# Patient Record
Sex: Female | Born: 1975 | Race: White | Hispanic: No | Marital: Married | Smoking: Never smoker
Health system: Southern US, Community
[De-identification: ages and names within clinical notes are randomized; demographics above are authoritative.]

## PROBLEM LIST (undated history)

## (undated) DIAGNOSIS — Z9989 Dependence on other enabling machines and devices: Secondary | ICD-10-CM

## (undated) DIAGNOSIS — G4733 Obstructive sleep apnea (adult) (pediatric): Secondary | ICD-10-CM

## (undated) DIAGNOSIS — E039 Hypothyroidism, unspecified: Secondary | ICD-10-CM

## (undated) DIAGNOSIS — H35413 Lattice degeneration of retina, bilateral: Secondary | ICD-10-CM

## (undated) DIAGNOSIS — F319 Bipolar disorder, unspecified: Secondary | ICD-10-CM

## (undated) DIAGNOSIS — H02889 Meibomian gland dysfunction of unspecified eye, unspecified eyelid: Secondary | ICD-10-CM

## (undated) HISTORY — DX: Bipolar disorder, unspecified: F31.9

## (undated) HISTORY — DX: Hypothyroidism, unspecified: E03.9

## (undated) HISTORY — DX: Dependence on other enabling machines and devices: Z99.89

## (undated) HISTORY — PX: LAPAROSCOPY FOR ECTOPIC PREGNANCY: SUR765

## (undated) HISTORY — DX: Obstructive sleep apnea (adult) (pediatric): G47.33

## (undated) HISTORY — DX: Lattice degeneration of retina, bilateral: H35.413

## (undated) HISTORY — DX: Meibomian gland dysfunction of unspecified eye, unspecified eyelid: H02.889

## (undated) HISTORY — PX: BREAST REDUCTION SURGERY: SHX8

---

## 2004-02-07 ENCOUNTER — Inpatient Hospital Stay (HOSPITAL_COMMUNITY): Admission: AD | Admit: 2004-02-07 | Discharge: 2004-02-07 | Payer: Self-pay | Admitting: Obstetrics and Gynecology

## 2004-12-17 ENCOUNTER — Inpatient Hospital Stay (HOSPITAL_COMMUNITY): Admission: AD | Admit: 2004-12-17 | Discharge: 2004-12-17 | Payer: Self-pay | Admitting: Gynecology

## 2004-12-18 ENCOUNTER — Ambulatory Visit (HOSPITAL_COMMUNITY): Admission: RE | Admit: 2004-12-18 | Discharge: 2004-12-18 | Payer: Self-pay | Admitting: *Deleted

## 2006-08-12 ENCOUNTER — Other Ambulatory Visit: Admission: RE | Admit: 2006-08-12 | Discharge: 2006-08-12 | Payer: Self-pay | Admitting: Gynecology

## 2012-01-02 ENCOUNTER — Other Ambulatory Visit: Payer: Self-pay | Admitting: Family Medicine

## 2012-02-11 ENCOUNTER — Encounter: Payer: Self-pay | Admitting: Sports Medicine

## 2012-02-11 ENCOUNTER — Ambulatory Visit (INDEPENDENT_AMBULATORY_CARE_PROVIDER_SITE_OTHER): Payer: PRIVATE HEALTH INSURANCE | Admitting: Sports Medicine

## 2012-02-11 VITALS — BP 122/83 | HR 70 | Wt 146.0 lb

## 2012-02-11 DIAGNOSIS — M79609 Pain in unspecified limb: Secondary | ICD-10-CM

## 2012-02-11 DIAGNOSIS — Q667 Congenital pes cavus, unspecified foot: Secondary | ICD-10-CM

## 2012-02-11 DIAGNOSIS — M79672 Pain in left foot: Secondary | ICD-10-CM

## 2012-02-11 DIAGNOSIS — M19079 Primary osteoarthritis, unspecified ankle and foot: Secondary | ICD-10-CM | POA: Insufficient documentation

## 2012-02-11 DIAGNOSIS — R269 Unspecified abnormalities of gait and mobility: Secondary | ICD-10-CM

## 2012-02-11 MED ORDER — MELOXICAM 15 MG PO TABS: ORAL_TABLET | ORAL | Status: DC

## 2012-02-11 NOTE — Assessment & Plan Note (Addendum)
Symptoms are predominantly due to metatarsocuneiform synovitis at the first tarsometatarsal joint. 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 She has good running shoes, however they do not sufficiently control her foot motion. I placed a medium scaphoid pads to both her shoes, and these control her motion well. I opted not to place metatarsal pads to correct her transverse arch breakdown, as she has no metatarsal pain. I would like her to do this for approximately 3 weeks. She also has weak abductors which we will work on hip abductor strengthening protocol given. She may also do Mobic daily for 2 weeks. I will see her back in 3 weeks, and if does well with the scaphoid pads, we can make her some custom molded orthotics. I do not think she has a stress injury at this point, I think it appropriate for her to continue running.  If this persists for a couple of months, and her pain continues to be localized at the metatarsocuneiform joint, we can consider an injection into the joint. I would certainly want to correct her biomechanics first.

## 2012-02-11 NOTE — Progress Notes (Signed)
Subjective:    I'm seeing this patient as a consultation for:  Dr. Angelena Sole  CC: Left foot pain  HPI: Gina Casey is a very pleasant 36 year old female, who is currently training for a half marathon. For the past 6 weeks she's noted increasing pain which he localizes over the medial aspect of the first tarsometatarsal joint of her left foot.  This was initially significantly swollen, however improved with icing. Since then she's tried changing her shoes, but this has not helped. The pain is localized, does not radiate, it is achy in nature.  She denies any trauma, denies any pain behind the medial, or lateral malleolar, and notes occasional numbness in the tip of the foot, but not on the plantar aspect. She has recently increased her mileage, has not not changed her running surface.  Unfortunately, lately she's also starting to develop some pain under both kneecaps, that occurs with some grinding if she goes up and down stairs.  Past medical history, Surgical history, Family history, Social history, Allergies, and medications have been entered into the medical record, reviewed, and no changes needed.   Review of Systems: No headache, visual changes, nausea, vomiting, diarrhea, constipation, dizziness, abdominal pain, skin rash, fevers, chills, night sweats, weight loss, swollen lymph nodes, body aches, joint swelling, muscle aches, chest pain, or shortness of breath.   Objective:   Vitals:  Afebrile, vital signs stable. General: Well Developed, well nourished, and in no acute distress.  Neuro/Psych: Alert and oriented x3, extra-ocular muscles intact, able to move all 4 extremities.  Skin: Warm and dry, no rashes noted.  Respiratory: Not using accessory muscles, speaking in full sentences, trachea midline.  Cardiovascular: Pulses palpable, no extremity edema. Abdomen: Does not appear distended. Left foot shows flexible pes cavus is symmetric bilaterally. There is some fullness between the medial  cuneiform, and the first metatarsal. This is very tender to palpation. There is no erythema. She has no tenderness over the tibialis anterior, extensor hallucis longus, extensor digitorum, tibialis posterior, flexor hallucis longus, or flexor digitorum tendons. There is no pain over the navicular prominence, or the N-spot.  Anterior drawer test is negative, kleiger test is negative, squeeze test is negative.  She has significant breakdown of the transverse arch with abnormal callus between the second and fourth metatarsal heads. She also has bunions, and bunionette.  Hip abductor strength is significantly weak bilaterally.  She runs with a fairly neutral gait.  Procedure: Limited ultrasound of left foot Device: GE logiq E. Findings: Tibialis anterior, extensor hallucis longus, extensor digitorum longus, tibialis posterior, flexor pollicis longus, flexor digitorum longus tendons are unremarkable in long, and transverse axes, without any halo sign. She does have an effusion at the metatarsocuneiform joint of the first ray.  Impression: First metatarsocuneiform joint effusion.  Images permanently stored in the unit and are available for review.  Impression and Recommendations:   This case required medical decision making of moderate complexity.

## 2012-02-11 NOTE — Patient Instructions (Addendum)
Hip home rehabilitation protocol:  1.  Side leg raises.  3x30 with no weight, then 3x15 with 2 lb ankle weight, then 3x15 with 5 lb ankle weight 2.  Standing hip rotation.  3x30 with no weight, then 3x15 with 2 lb ankle weight, then 3x15 with 5 lb ankle weight. 3.  Side step ups.  3x30 with no weight, then 3x15 with 5 lbs in backpack, then 3x15 with 10 lbs in backpack.  Mobic.  Pads for shoes.  Come back in 3 weeks to reassess, consider custom orthotics at that point.

## 2012-02-13 ENCOUNTER — Institutional Professional Consult (permissible substitution): Payer: Self-pay | Admitting: Sports Medicine

## 2012-03-03 ENCOUNTER — Ambulatory Visit (INDEPENDENT_AMBULATORY_CARE_PROVIDER_SITE_OTHER): Payer: PRIVATE HEALTH INSURANCE | Admitting: Sports Medicine

## 2012-03-03 ENCOUNTER — Encounter: Payer: Self-pay | Admitting: *Deleted

## 2012-03-03 ENCOUNTER — Encounter: Payer: Self-pay | Admitting: Sports Medicine

## 2012-03-03 ENCOUNTER — Ambulatory Visit (INDEPENDENT_AMBULATORY_CARE_PROVIDER_SITE_OTHER): Payer: PRIVATE HEALTH INSURANCE

## 2012-03-03 VITALS — BP 120/79 | HR 76 | Wt 145.0 lb

## 2012-03-03 DIAGNOSIS — M79672 Pain in left foot: Secondary | ICD-10-CM

## 2012-03-03 DIAGNOSIS — M79609 Pain in unspecified limb: Secondary | ICD-10-CM

## 2012-03-03 DIAGNOSIS — M25569 Pain in unspecified knee: Secondary | ICD-10-CM

## 2012-03-03 DIAGNOSIS — M222X9 Patellofemoral disorders, unspecified knee: Secondary | ICD-10-CM | POA: Insufficient documentation

## 2012-03-03 DIAGNOSIS — M7989 Other specified soft tissue disorders: Secondary | ICD-10-CM

## 2012-03-03 DIAGNOSIS — M25473 Effusion, unspecified ankle: Secondary | ICD-10-CM

## 2012-03-03 NOTE — Assessment & Plan Note (Addendum)
First metatarsocuneiform joint injection as above. Continue scaphoid pads, and hip rehabilitation exercises. We'll add patellofemoral pain syndrome rehabilitation exercises. X-ray of her left foot. If no better at the next visit in 4 weeks, we can consider injection of her knee joint. I would like to make her custom orthotics at the next visit. I have deferred that from this visit, as I do not want to change her gait too much before her half marathon.

## 2012-03-03 NOTE — Assessment & Plan Note (Signed)
Mobic. PFPS rehab exercises. RTC 4 weeks, inject if no better.

## 2012-03-03 NOTE — Progress Notes (Signed)
SPORTS MEDICINE CONSULTATION REPORT  Subjective:    CC: Followup  HPI: Gina Casey comes back to see me, she is about 9 weeks into having pain in her left foot, and both knees. I diagnosed her with metatarsocuneiform joint effusion as well as runner's knee at the last visit. She had weak hip abductors which she's been working on extensively. I also placed scaphoid pads in her shoes to correct overpronation.  First metatarsocuneiform joint pain: Overall about the same since last visit. Little bit better with Mobic, but not resolved. Localized, moderate, does not radiate.  Knee pain: Bilateral, anterior, worse going up and down stairs, injury classic for patellofemoral pain syndrome. Her hip abductors were weak, she's been working on these, we have not yet had her working the vastus medialis strengthening. She does have a half marathon coming up in 2 weeks.  Past medical history, Surgical history, Family history, Social history, Allergies, and medications have been entered into the medical record, reviewed, and no changes needed.   Review of Systems: No headache, visual changes, nausea, vomiting, diarrhea, constipation, dizziness, abdominal pain, skin rash, fevers, chills, night sweats, weight loss, swollen lymph nodes, body aches, joint swelling, muscle aches, chest pain, or shortness of breath.   Objective:   Vitals:  Afebrile, vital signs stable. General: Well Developed, well nourished, and in no acute distress.  Neuro/Psych: Alert and oriented x3, extra-ocular muscles intact, able to move all 4 extremities.  Skin: Warm and dry, no rashes noted.  Respiratory: Not using accessory muscles, speaking in full sentences, trachea midline.  Cardiovascular: Pulses palpable, no extremity edema. Abdomen: Does not appear distended. Bilateral Knee: Normal to inspection with no erythema or effusion or obvious bony abnormalities. Palpation normal with no warmth, joint line tenderness, patellar tenderness, or  condyle tenderness. ROM full in flexion and extension and lower leg rotation. Ligaments with solid consistent endpoints including ACL, PCL, LCL, MCL. Negative Mcmurray's, Apley's, and Thessalonian tests. Patellar compression is painful and with crepitus. Patellar and quadriceps tendons unremarkable. Hamstring and quadriceps strength is normal.   Hip abductor strength is greatly improved.  Still tender to palpation over left first metatarsal cuneiform joint. There is some fullness here as well. She has excellent strength and no pain to resisted dorsiflexion of the foot. This suggested the tibialis anterior is not involved.  Procedure: Real-time Ultrasound Guided Injection of 1st metatarsocuneiform joint. Device: GE Logiq E  Ultrasound guided injection is preferred based studies that show increased duration, increased effect, greater accuracy, decreased procedural pain, increased response rate, and decreased cost with ultrasound guided versus blind injection.  Verbal informed consent obtained.  Time-out conducted.  Noted no overlying erythema, induration, or other signs of local infection.  Skin prepped in a sterile fashion.  Local anesthesia: Topical Ethyl chloride.  With sterile technique and under real time ultrasound guidance:  1/2 cc kenalog 40, 1 cc lidocaine into joint. Completed without difficulty  Pain immediately resolved suggesting accurate placement of the medication.  Advised to call if fevers/chills, erythema, induration, drainage, or persistent bleeding.  Images permanently stored and available for review in the ultrasound unit.  Impression: Technically successful ultrasound guided injection.  I did review the x-rays, and they show some soft tissue swelling over the first metatarsocuneiform joint without any discrete bony abnormalities.  Impression and Recommendations:   This case required medical decision making of moderate complexity.

## 2012-03-31 ENCOUNTER — Encounter: Payer: Self-pay | Admitting: Sports Medicine

## 2012-03-31 ENCOUNTER — Ambulatory Visit (INDEPENDENT_AMBULATORY_CARE_PROVIDER_SITE_OTHER): Payer: PRIVATE HEALTH INSURANCE | Admitting: Sports Medicine

## 2012-03-31 VITALS — BP 126/82 | HR 80 | Wt 144.0 lb

## 2012-03-31 DIAGNOSIS — M79609 Pain in unspecified limb: Secondary | ICD-10-CM

## 2012-03-31 DIAGNOSIS — M25569 Pain in unspecified knee: Secondary | ICD-10-CM

## 2012-03-31 DIAGNOSIS — M79672 Pain in left foot: Secondary | ICD-10-CM

## 2012-03-31 DIAGNOSIS — M222X9 Patellofemoral disorders, unspecified knee: Secondary | ICD-10-CM

## 2012-03-31 NOTE — Assessment & Plan Note (Signed)
Hip Abductor strength, and patellofemoral pain improved significantly after 4 weeks of vastus medialis rehabilitation exercises. She does endorse that she has not been entirely compliant with the exercises either. She will continue these, and I have wished her luck at her next half marathon.

## 2012-03-31 NOTE — Progress Notes (Signed)
SPORTS MEDICINE CONSULTATION REPORT  Subjective:    CC: followup  NWG:NFAOZ comes back to see me for bilateral knee, and left foot pain. I injected her first metatarsocuneiform joint under ultrasound guidance, she reports that this is 90% improved. Her bilateral knee pain has improved significantly with patellofemoral rehabilitation exercises as well as hip abductor rehabilitation. She did very well and her recent half marathon, and is eager to run an additional one next saturday.   Past medical history, Surgical history, Family history, Social history, Allergies, and medications have been entered into the medical record, reviewed, and no changes needed.   Review of Systems: No headache, visual changes, nausea, vomiting, diarrhea, constipation, dizziness, abdominal pain, skin rash, fevers, chills, night sweats, weight loss, swollen lymph nodes, body aches, joint swelling, muscle aches, chest pain, shortness of breath, mood changes, visual or auditory hallucinations.   Objective:   Vitals:  Afebrile, vital signs stable. General: Well Developed, well nourished, and in no acute distress.  Neuro/Psych: Alert and oriented x3, extra-ocular muscles intact, able to move all 4 extremities.  Skin: Warm and dry, no rashes noted.  Respiratory: Not using accessory muscles, speaking in full sentences, trachea midline.  Cardiovascular: Pulses palpable, no extremity edema. Abdomen: Does not appear distended. Bilateral Knee: Normal to inspection with no erythema or effusion or obvious bony abnormalities. Palpation normal with no warmth, joint line tenderness, patellar tenderness, or condyle tenderness. ROM full in flexion and extension and lower leg rotation. Ligaments with solid consistent endpoints including ACL, PCL, LCL, MCL. Negative Mcmurray's, Apley's, and Thessalonian tests. Non painful patellar compression. Patellar glide without crepitus. Patellar and quadriceps tendons unremarkable. Hamstring  and quadriceps strength is normal.  Hip abductor strength is excellent. Feet: Again, bilateral pes cavus, hallux valgus, bilaterally, no longer tender over the first metatarsocuneiform joint on the left foot  Impression and Recommendations:   This case required medical decision making of moderate complexity.

## 2012-03-31 NOTE — Assessment & Plan Note (Addendum)
Pain at the first metatarsocuneiform joint is 90% resolved after a single ultrasound guided intra-articular injection. She should continue her rehabilitation exercises, as well as scaphoid pads. If it ain't broke, don't fix it, so we will not build her custom orthotics yet. I will see her back in one month.

## 2012-05-03 ENCOUNTER — Encounter: Payer: Self-pay | Admitting: Sports Medicine

## 2012-05-03 ENCOUNTER — Ambulatory Visit (INDEPENDENT_AMBULATORY_CARE_PROVIDER_SITE_OTHER): Payer: PRIVATE HEALTH INSURANCE | Admitting: Sports Medicine

## 2012-05-03 VITALS — BP 113/78 | HR 81 | Wt 142.0 lb

## 2012-05-03 DIAGNOSIS — M222X9 Patellofemoral disorders, unspecified knee: Secondary | ICD-10-CM

## 2012-05-03 DIAGNOSIS — R269 Unspecified abnormalities of gait and mobility: Secondary | ICD-10-CM

## 2012-05-03 DIAGNOSIS — M25569 Pain in unspecified knee: Secondary | ICD-10-CM

## 2012-05-03 NOTE — Assessment & Plan Note (Signed)
She will come back for custom orthotics. We will likely only bill the office visit.

## 2012-05-03 NOTE — Progress Notes (Signed)
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 Unfortunately, she is not been doing her patellofemoral rehabilitation exercises, and as a result continues to have pain under both kneecaps when she goes up and down stairs, squats, or gets up out of a chair. Unfortunately this has decreased her ability to run.  She has been working on her hip abductor rehabilitation and this is improved significantly.  Foot pain: She is status post a metatarsal medial cuneiform joint injection in the past. This continues to be improved. She is interested in custom orthotics, she does have scaphoid pads in both shoes.   Past medical history, Surgical history, Family history, Social history, Allergies, and medications have been entered into the medical record, reviewed, and no changes needed.   Review of Systems: No headache, visual changes, nausea, vomiting, diarrhea, constipation, dizziness, abdominal pain, skin rash, fevers, chills, night sweats, weight loss, swollen lymph nodes, body aches, joint swelling, muscle aches, chest pain, shortness of breath, mood changes, visual or auditory hallucinations.   Objective:   Vitals:  Afebrile, vital signs stable. General: Well Developed, well nourished, and in no acute distress.  Neuro/Psych: Alert and oriented x3, extra-ocular muscles intact, able to move all 4 extremities.  Skin: Warm and dry, no rashes noted.  Respiratory: Not using accessory muscles, speaking in full sentences, trachea midline.  Cardiovascular: Pulses palpable, no extremity edema. Abdomen: Does not appear distended. Bilateral Knees: Normal to inspection with no erythema or effusion or obvious bony abnormalities. Palpation normal with no warmth, joint line tenderness, patellar tenderness, or condyle tenderness. ROM full in flexion and extension and lower leg rotation. Ligaments  with solid consistent endpoints including ACL, PCL, LCL, MCL. Negative Mcmurray's, Apley's, and Thessalonian tests. Non painful patellar compression. Patellar glide without crepitus. Patellar and quadriceps tendons unremarkable. Hamstring and quadriceps strength is normal.    hip abductor strength is excellent bilaterally.   Impression and Recommendations:   This case required medical decision making of moderate complexity.

## 2012-05-03 NOTE — Assessment & Plan Note (Signed)
She'll come back for an appointment of the custom orthotics. We will likely bill only the office visit. Formal physical therapy for patellofemoral syndrome as well as McConnell taping. Return to clinic in 3-4 weeks, consider injection bilaterally if no better.

## 2012-05-12 ENCOUNTER — Ambulatory Visit: Payer: PRIVATE HEALTH INSURANCE | Attending: Sports Medicine | Admitting: Physical Therapy

## 2012-05-12 DIAGNOSIS — IMO0001 Reserved for inherently not codable concepts without codable children: Secondary | ICD-10-CM | POA: Insufficient documentation

## 2012-05-12 DIAGNOSIS — M25569 Pain in unspecified knee: Secondary | ICD-10-CM | POA: Insufficient documentation

## 2012-05-12 DIAGNOSIS — M6281 Muscle weakness (generalized): Secondary | ICD-10-CM | POA: Insufficient documentation

## 2012-05-12 DIAGNOSIS — R293 Abnormal posture: Secondary | ICD-10-CM | POA: Insufficient documentation

## 2012-05-12 DIAGNOSIS — M25469 Effusion, unspecified knee: Secondary | ICD-10-CM | POA: Insufficient documentation

## 2012-05-14 ENCOUNTER — Ambulatory Visit: Payer: PRIVATE HEALTH INSURANCE | Admitting: Physical Therapy

## 2012-05-17 ENCOUNTER — Ambulatory Visit: Payer: PRIVATE HEALTH INSURANCE | Admitting: Physical Therapy

## 2012-05-18 ENCOUNTER — Ambulatory Visit (INDEPENDENT_AMBULATORY_CARE_PROVIDER_SITE_OTHER): Payer: PRIVATE HEALTH INSURANCE | Admitting: Sports Medicine

## 2012-05-18 ENCOUNTER — Encounter: Payer: Self-pay | Admitting: Sports Medicine

## 2012-05-18 VITALS — BP 112/74 | HR 86 | Wt 144.0 lb

## 2012-05-18 DIAGNOSIS — M25569 Pain in unspecified knee: Secondary | ICD-10-CM

## 2012-05-18 DIAGNOSIS — M79672 Pain in left foot: Secondary | ICD-10-CM

## 2012-05-18 DIAGNOSIS — Q667 Congenital pes cavus, unspecified foot: Secondary | ICD-10-CM

## 2012-05-18 DIAGNOSIS — R269 Unspecified abnormalities of gait and mobility: Secondary | ICD-10-CM

## 2012-05-18 DIAGNOSIS — M79609 Pain in unspecified limb: Secondary | ICD-10-CM

## 2012-05-18 DIAGNOSIS — M222X9 Patellofemoral disorders, unspecified knee: Secondary | ICD-10-CM

## 2012-05-18 NOTE — Progress Notes (Signed)
Patient was fitted for a : standard, cushioned, semi-rigid orthotic. The orthotic was heated and afterward the patient stood on the orthotic blank positioned on the orthotic stand. The patient was positioned in subtalar neutral position and 10 degrees of ankle dorsiflexion in a weight bearing stance. After completion of molding, a stable base was applied to the orthotic blank. The blank was ground to a stable position for weight bearing. Size 7 Base: Blue EVA Additional Posting and Padding: None The patient ambulated these, and they were very comfortable.

## 2012-05-25 ENCOUNTER — Ambulatory Visit: Payer: PRIVATE HEALTH INSURANCE | Admitting: Physical Therapy

## 2012-06-18 ENCOUNTER — Ambulatory Visit: Payer: PRIVATE HEALTH INSURANCE | Admitting: Sports Medicine

## 2012-06-18 DIAGNOSIS — Z0289 Encounter for other administrative examinations: Secondary | ICD-10-CM

## 2012-11-26 DIAGNOSIS — H35413 Lattice degeneration of retina, bilateral: Secondary | ICD-10-CM

## 2012-11-26 DIAGNOSIS — H5210 Myopia, unspecified eye: Secondary | ICD-10-CM | POA: Insufficient documentation

## 2012-11-26 DIAGNOSIS — H02889 Meibomian gland dysfunction of unspecified eye, unspecified eyelid: Secondary | ICD-10-CM

## 2012-11-26 HISTORY — DX: Lattice degeneration of retina, bilateral: H35.413

## 2012-11-26 HISTORY — DX: Meibomian gland dysfunction of unspecified eye, unspecified eyelid: H02.889

## 2014-01-31 ENCOUNTER — Ambulatory Visit: Payer: PRIVATE HEALTH INSURANCE | Admitting: Sports Medicine

## 2014-01-31 ENCOUNTER — Encounter: Payer: Self-pay | Admitting: Sports Medicine

## 2014-01-31 ENCOUNTER — Ambulatory Visit (INDEPENDENT_AMBULATORY_CARE_PROVIDER_SITE_OTHER): Payer: PRIVATE HEALTH INSURANCE | Admitting: Sports Medicine

## 2014-01-31 VITALS — BP 120/62 | HR 78 | Ht 64.0 in | Wt 143.0 lb

## 2014-01-31 DIAGNOSIS — M79672 Pain in left foot: Secondary | ICD-10-CM

## 2014-01-31 NOTE — Assessment & Plan Note (Signed)
Overall did extremely well after her initial left-sided first metatarsocuneiform injection 2 years ago. Unfortunately discontinued her custom orthotics, and now has a recurrence of pain. Continue orthotics, injection as above, ice after running. She is doing approximately 30 miles per week. I would like to see her before her marathon on November 1.

## 2014-01-31 NOTE — Progress Notes (Signed)
  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 Initially we treated her for the first metatarsal cuboid osteoarthritis, she was doing extremely well in her custom orthotics, and post injection 2 years ago until she discontinued her orthotics during training for a marathon coming up in November. She now has pain in the exact same location, severe, persistent. She is currently doing approximately 30 miles a week. Pain does not radiate.  Past medical history, Surgical history, Family history not pertinant except as noted below, Social history, Allergies, and medications have been entered into the medical record, reviewed, and no changes needed.   Review of Systems: No fevers, chills, night sweats, weight loss, chest pain, or shortness of breath.   Objective:    General: Well Developed, well nourished, and in no acute distress.  Neuro: Alert and oriented x3, extra-ocular muscles intact, sensation grossly intact.  HEENT: Normocephalic, atraumatic, pupils equal round reactive to light, neck supple, no masses, no lymphadenopathy, thyroid nonpalpable.  Skin: Warm and dry, no rashes. Cardiac: Regular rate and rhythm, no murmurs rubs or gallops, no lower extremity edema.  Respiratory: Clear to auscultation bilaterally. Not using accessory muscles, speaking in full sentences. Left Foot: No visible erythema or swelling. Range of motion is full in all directions. Strength is 5/5 in all directions. No hallux valgus. No pes cavus or pes planus. No abnormal callus noted. No pain over the navicular prominence, or base of fifth metatarsal. No tenderness to palpation of the calcaneal insertion of plantar fascia. No pain at the Achilles insertion. No pain over the calcaneal bursa. No pain of the retrocalcaneal bursa. Visible swelling with tenderness to palpation at the first metatarsal cuneiform joint No hallux rigidus or  limitus. No tenderness palpation over interphalangeal joints. No pain with compression of the metatarsal heads. Neurovascularly intact distally.  Procedure: Real-time Ultrasound Guided Injection of left first metatarsal cuneiform joint Device: GE Logiq E  Verbal informed consent obtained.  Time-out conducted.  Noted no overlying erythema, induration, or other signs of local infection.  Skin prepped in a sterile fashion.  Local anesthesia: Topical Ethyl chloride.  With sterile technique and under real time ultrasound guidance:  25-gauge needle advanced into the joint, 0.5 cc kenalog 40, 0.5 cc lidocaine injected easily. Completed without difficulty  Pain immediately resolved suggesting accurate placement of the medication.  Advised to call if fevers/chills, erythema, induration, drainage, or persistent bleeding.  Images permanently stored and available for review in the ultrasound unit.  Impression: Technically successful ultrasound guided injection.  Impression and Recommendations:

## 2014-02-21 ENCOUNTER — Ambulatory Visit: Payer: PRIVATE HEALTH INSURANCE | Admitting: Sports Medicine

## 2014-05-19 ENCOUNTER — Ambulatory Visit: Payer: PRIVATE HEALTH INSURANCE | Admitting: Sports Medicine

## 2014-05-24 ENCOUNTER — Ambulatory Visit (INDEPENDENT_AMBULATORY_CARE_PROVIDER_SITE_OTHER): Payer: BLUE CROSS/BLUE SHIELD | Admitting: Sports Medicine

## 2014-05-24 ENCOUNTER — Encounter: Payer: Self-pay | Admitting: Sports Medicine

## 2014-05-24 DIAGNOSIS — M129 Arthropathy, unspecified: Secondary | ICD-10-CM | POA: Diagnosis not present

## 2014-05-24 DIAGNOSIS — M19079 Primary osteoarthritis, unspecified ankle and foot: Secondary | ICD-10-CM

## 2014-05-24 MED ORDER — DICLOFENAC SODIUM 2 % TD SOLN: 2.0000 | Freq: Two times a day (BID) | TRANSDERMAL | Status: DC

## 2014-05-24 NOTE — Assessment & Plan Note (Signed)
Worst at the first metatarsal/medial cuneiform joint. Adding topical diclofenac Previous injection was 3 months ago. Unfortunately pain did recur approximately a month ago. Repeat first metatarsal\medial cuneiform injection this time with Depo-Medrol. She does well in her custom orthotics however her high heels do not support her pes cavus very well. Return in a month to see how things are going, next step would be joint fusion.

## 2014-05-24 NOTE — Progress Notes (Signed)
  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 Is localized in the same place with swelling. She did well in her marathon in November. 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 Her high heels do have a three quarters length rigid plastic orthotic however are significantly insufficient with regards to support of her midfoot. Pain is moderate, persistent and localized without radiation.  Past medical history, Surgical history, Family history not pertinant except as noted below, Social history, Allergies, and medications have been entered into the medical record, reviewed, and no changes needed.   Review of Systems: No fevers, chills, night sweats, weight loss, chest pain, or shortness of breath.   Objective:    General: Well Developed, well nourished, and in no acute distress.  Neuro: Alert and oriented x3, extra-ocular muscles intact, sensation grossly intact.  HEENT: Normocephalic, atraumatic, pupils equal round reactive to light, neck supple, no masses, no lymphadenopathy, thyroid nonpalpable.  Skin: Warm and dry, no rashes. Cardiac: Regular rate and rhythm, no murmurs rubs or gallops, no lower extremity edema.  Respiratory: Clear to auscultation bilaterally. Not using accessory muscles, speaking in full sentences. Left Foot: No visible erythema or swelling. Range of motion is full in all directions. Strength is 5/5 in all directions. No hallux valgus. No pes cavus or pes planus. No abnormal callus noted. No pain over the navicular prominence, or base of fifth metatarsal. No tenderness to palpation of the calcaneal insertion of plantar fascia. No pain at the Achilles insertion. No pain over the calcaneal  bursa. No pain of the retrocalcaneal bursa. Swelling with tenderness to palpation over the first metatarsocuneiform joint No hallux rigidus or limitus. No tenderness palpation over interphalangeal joints. No pain with compression of the metatarsal heads. Neurovascularly intact distally.  Procedure: Real-time Ultrasound Guided Injection of left first metatarsocuneiform joint Device: GE Logiq E  Verbal informed consent obtained.  Time-out conducted.  Noted no overlying erythema, induration, or other signs of local infection.  Skin prepped in a sterile fashion.  Local anesthesia: Topical Ethyl chloride.  With sterile technique and under real time ultrasound guidance:  25-gauge needle advanced into joint, 0.5 mL Depo-Medrol 40, 0.5 mL lidocaine injected easily. Completed without difficulty  Pain immediately resolved suggesting accurate placement of the medication.  Advised to call if fevers/chills, erythema, induration, drainage, or persistent bleeding.  Images permanently stored and available for review in the ultrasound unit.  Impression: Technically successful ultrasound guided injection.  Scaphoid has were placed in the patient's high heels.  Impression and Recommendations:

## 2014-06-22 ENCOUNTER — Ambulatory Visit (INDEPENDENT_AMBULATORY_CARE_PROVIDER_SITE_OTHER): Payer: BLUE CROSS/BLUE SHIELD | Admitting: Sports Medicine

## 2014-06-22 ENCOUNTER — Encounter: Payer: Self-pay | Admitting: Sports Medicine

## 2014-06-22 DIAGNOSIS — M129 Arthropathy, unspecified: Secondary | ICD-10-CM | POA: Diagnosis not present

## 2014-06-22 DIAGNOSIS — M19079 Primary osteoarthritis, unspecified ankle and foot: Secondary | ICD-10-CM

## 2014-06-22 NOTE — Assessment & Plan Note (Signed)
Unfortunately we have now failed custom orthotics, topical anti-inflammatories, 2 injections into the metatarsocuneiform joint. She does run approximately 10 miles a week. We are going to proceed with an MRI, this appears to be a stress reaction, we will mobilize her with limited weightbearing, it appears to be simple degenerative change she will probably need a fusion. Return to go over MRI results.

## 2014-06-22 NOTE — Progress Notes (Signed)
  Subjective:    CC: Follow-up  HPI: Gina Casey is now had symptoms for over a year, she is a runner, approximately 10 miles per week and has had pain in the articulation of the first metatarsal and medial cuneiform. We tried topical agents, rehabilitation for greater than 6 weeks, custom orthotics, oral medications, and injections. She is improved significantly since the last visit, however continues to have pain and swelling at this location.  Past medical history, Surgical history, Family history not pertinant except as noted below, Social history, Allergies, and medications have been entered into the medical record, reviewed, and no changes needed.   Review of Systems: No fevers, chills, night sweats, weight loss, chest pain, or shortness of breath.   Objective:    General: Well Developed, well nourished, and in no acute distress.  Neuro: Alert and oriented x3, extra-ocular muscles intact, sensation grossly intact.  HEENT: Normocephalic, atraumatic, pupils equal round reactive to light, neck supple, no masses, no lymphadenopathy, thyroid nonpalpable.  Skin: Warm and dry, no rashes. Cardiac: Regular rate and rhythm, no murmurs rubs or gallops, no lower extremity edema.  Respiratory: Clear to auscultation bilaterally. Not using accessory muscles, speaking in full sentences. Left Foot: No visible erythema or swelling. Range of motion is full in all directions. Strength is 5/5 in all directions. No hallux valgus. No pes cavus or pes planus. No abnormal callus noted. No pain over the navicular prominence, or base of fifth metatarsal. No tenderness to palpation of the calcaneal insertion of plantar fascia. No pain at the Achilles insertion. No pain over the calcaneal bursa. No pain of the retrocalcaneal bursa. Tender to palpation at the first metatarsal/cuneiform joint. No hallux rigidus or limitus. No tenderness palpation over interphalangeal joints. No pain with compression of the  metatarsal heads. Neurovascularly intact distally.  Impression and Recommendations:

## 2014-06-29 ENCOUNTER — Ambulatory Visit (INDEPENDENT_AMBULATORY_CARE_PROVIDER_SITE_OTHER): Payer: BLUE CROSS/BLUE SHIELD

## 2014-06-29 DIAGNOSIS — M25572 Pain in left ankle and joints of left foot: Secondary | ICD-10-CM

## 2014-07-12 ENCOUNTER — Encounter: Payer: Self-pay | Admitting: Sports Medicine

## 2014-07-12 ENCOUNTER — Ambulatory Visit (INDEPENDENT_AMBULATORY_CARE_PROVIDER_SITE_OTHER): Payer: BLUE CROSS/BLUE SHIELD | Admitting: Sports Medicine

## 2014-07-12 ENCOUNTER — Telehealth: Payer: Self-pay | Admitting: Sports Medicine

## 2014-07-12 DIAGNOSIS — M19079 Primary osteoarthritis, unspecified ankle and foot: Secondary | ICD-10-CM

## 2014-07-12 DIAGNOSIS — M129 Arthropathy, unspecified: Secondary | ICD-10-CM

## 2014-07-12 NOTE — Telephone Encounter (Signed)
FYI. Dr Karie Schwalbe, Ms. Kallie LocksKissane is scheduled to see Dr Ihor GullyBiggerstaff on March 23rd. She is aware of appt.

## 2014-07-12 NOTE — Assessment & Plan Note (Signed)
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 Her MRI does show some tibialis anterior and flexor pollicis longus tendinitis however pain is not referable to the structures, at this point I do think she is a candidate for a tarsometatarsal fusion type procedure, I would like her to see Dr. Toni ArthursJohn Hewitt with foot and ankle surgery. When she runs, she should definitely ice the joint immediately afterwards. [place] does [place] on [place] 6.

## 2014-07-12 NOTE — Progress Notes (Signed)
  Subjective:    CC: Follow-up MRI  HPI: I have been treating Gina Casey now for over one year, she has medial cuneiform/first metatarsal osteoarthritis with synovitis, she's responded relatively well to injections, custom orthotics, and meloxicam however unfortunately is continuing to have pain, persistent. At this point she is failed medical management. [place] does [place] in November. We obtained an MRI and she is here to go over results.  Past medical history, Surgical history, Family history not pertinant except as noted below, Social history, Allergies, and medications have been entered into the medical record, reviewed, and no changes needed.   Review of Systems: No fevers, chills, night sweats, weight loss, chest pain, or shortness of breath.   Objective:    General: Well Developed, well nourished, and in no acute distress.  Neuro: Alert and oriented x3, extra-ocular muscles intact, sensation grossly intact.  HEENT: Normocephalic, atraumatic, pupils equal round reactive to light, neck supple, no masses, no lymphadenopathy, thyroid nonpalpable.  Skin: Warm and dry, no rashes. Cardiac: Regular rate and rhythm, no murmurs rubs or gallops, no lower extremity edema.  Respiratory: Clear to auscultation bilaterally. Not using accessory muscles, speaking in full sentences. Left Foot: No visible erythema or swelling. Range of motion is full in all directions. Strength is 5/5 in all directions. No hallux valgus. No pes cavus or pes planus. No abnormal callus noted. No pain over the navicular prominence, or base of fifth metatarsal. No tenderness to palpation of the calcaneal insertion of plantar fascia. No pain at the Achilles insertion. No pain over the calcaneal bursa. No pain of the retrocalcaneal bursa. Tender to palpation at the first metatarsocuneiform joint, there is palpable synovitis, she has no tenderness over the flexor hallucis longus or tibialis  anterior No hallux rigidus or limitus. No tenderness palpation over interphalangeal joints. No pain with compression of the metatarsal heads. Neurovascularly intact distally.  MRI shows age advanced osteoarthritis with synovitis at the first metatarsocuneiform joint, she also has tenosynovitis in the flexor hallucis longus as well as the tibialis anterior tendons, I do suspect these are likely simply reactive edema.  Impression and Recommendations:

## 2015-03-19 ENCOUNTER — Encounter: Payer: Self-pay | Admitting: Family Medicine

## 2015-03-19 ENCOUNTER — Ambulatory Visit (INDEPENDENT_AMBULATORY_CARE_PROVIDER_SITE_OTHER): Payer: BLUE CROSS/BLUE SHIELD | Admitting: Family Medicine

## 2015-03-19 VITALS — BP 107/77 | HR 66 | Wt 149.0 lb

## 2015-03-19 DIAGNOSIS — M545 Low back pain, unspecified: Secondary | ICD-10-CM | POA: Insufficient documentation

## 2015-03-19 DIAGNOSIS — S39012A Strain of muscle, fascia and tendon of lower back, initial encounter: Secondary | ICD-10-CM

## 2015-03-19 DIAGNOSIS — S338XXA Sprain of other parts of lumbar spine and pelvis, initial encounter: Secondary | ICD-10-CM

## 2015-03-19 MED ORDER — CYCLOBENZAPRINE HCL 10 MG PO TABS: 10.0000 mg | ORAL_TABLET | Freq: Three times a day (TID) | ORAL | Status: DC | PRN

## 2015-03-19 MED ORDER — NAPROXEN 500 MG PO TABS: 500.0000 mg | ORAL_TABLET | Freq: Two times a day (BID) | ORAL | Status: AC

## 2015-03-19 NOTE — Assessment & Plan Note (Signed)
Symptoms most consistent with lumbosacral strain. Plan for naproxen Flexeril and physical therapy. Warned about gastric protection on NSAIDs. Recheck in one month.

## 2015-03-19 NOTE — Progress Notes (Signed)
Faxed copy of notes to  [phone].

## 2015-03-19 NOTE — Patient Instructions (Signed)
Thank you for coming in today. Take naproxen twice daily as needed for pain. Take Flexeril mostly at bedtime for muscle pain and spasm. Use a heating pad. Attended physical therapy. If you take naproxen for longer than a week at a time take it with Zantac, Pepcid, or Prilosec over-the-counter to protect your stomach.  TENS UNIT: This is helpful for muscle pain and spasm.   Search and Purchase a TENS 7000 2nd edition at www.tenspros.com. It should be less than $30.     TENS unit instructions: Do not shower or bathe with the unit on Turn the unit off before removing electrodes or batteries If the electrodes lose stickiness add a drop of water to the electrodes after they are disconnected from the unit and place on plastic sheet. If you continued to have difficulty, call the TENS unit company to purchase more electrodes. Do not apply lotion on the skin area prior to use. Make sure the skin is clean and dry as this will help prolong the life of the electrodes. After use, always check skin for unusual red areas, rash or other skin difficulties. If there are any skin problems, does not apply electrodes to the same area. Never remove the electrodes from the unit by pulling the wires. Do not use the TENS unit or electrodes other than as directed. Do not change electrode placement without consultating your therapist or physician. Keep 2 fingers with between each electrode. Wear time ratio is 2:1, on to off times.    For example on for 30 minutes off for 15 minutes and then on for 30 minutes off for 15 minutes   Lumbosacral Strain Lumbosacral strain is a strain of any of the parts that make up your lumbosacral vertebrae. Your lumbosacral vertebrae are the bones that make up the lower third of your backbone. Your lumbosacral vertebrae are held together by muscles and tough, fibrous tissue (ligaments).  CAUSES  A sudden blow to your back can cause lumbosacral strain. Also, anything that causes an  excessive stretch of the muscles in the low back can cause this strain. This is typically seen when people exert themselves strenuously, fall, lift heavy objects, bend, or crouch repeatedly. RISK FACTORS  Physically demanding work.  Participation in pushing or pulling sports or sports that require a sudden twist of the back (tennis, golf, baseball).  Weight lifting.  Excessive lower back curvature.  Forward-tilted pelvis.  Weak back or abdominal muscles or both.  Tight hamstrings. SIGNS AND SYMPTOMS  Lumbosacral strain may cause pain in the area of your injury or pain that moves (radiates) down your leg.  DIAGNOSIS Your health care provider can often diagnose lumbosacral strain through a physical exam. In some cases, you may need tests such as X-ray exams.  TREATMENT  Treatment for your lower back injury depends on many factors that your clinician will have to evaluate. However, most treatment will include the use of anti-inflammatory medicines. HOME CARE INSTRUCTIONS   Avoid hard physical activities (tennis, racquetball, waterskiing) if you are not in proper physical condition for it. This may aggravate or create problems.  If you have a back problem, avoid sports requiring sudden body movements. Swimming and walking are generally safer activities.  Maintain good posture.  Maintain a healthy weight.  For acute conditions, you may put ice on the injured area.  Put ice in a plastic bag.  Place a towel between your skin and the bag.  Leave the ice on for 20 minutes, 2-3 times a  day.  When the low back starts healing, stretching and strengthening exercises may be recommended. SEEK MEDICAL CARE IF:  Your back pain is getting worse.  You experience severe back pain not relieved with medicines. SEEK IMMEDIATE MEDICAL CARE IF:   You have numbness, tingling, weakness, or problems with the use of your arms or legs.  There is a change in bowel or bladder control.  You have  increasing pain in any area of the body, including your belly (abdomen).  You notice shortness of breath, dizziness, or feel faint.  You feel sick to your stomach (nauseous), are throwing up (vomiting), or become sweaty.  You notice discoloration of your toes or legs, or your feet get very cold. MAKE SURE YOU:   Understand these instructions.  Will watch your condition.  Will get help right away if you are not doing well or get worse.   This information is not intended to replace advice given to you by your health care provider. Make sure you discuss any questions you have with your health care provider.   Document Released: 01/22/2005 Document Revised: 05/05/2014 Document Reviewed: 12/01/2012 Elsevier Interactive Patient Education [employer]

## 2015-03-19 NOTE — Progress Notes (Signed)
   Subjective:    I'm seeing this patient as a consultation for:  Dr Angelena SoleWeston Saunders  CC: Back pain  HPI: Patient was in her normal state of health in early November when she [place]. She ran an about 5.5 hours. She notes that since following the run she has significant lumbar pain. The pain is located in the low back. The pain does not radiate. No weakness numbness bowel bladder dysfunction. She denies any specific injury. She's tried some over-the-counter medicines which helped a bit. Pain is worse when she stands from a seated position and with forward flexion. Pain is better with laying on her back. The pain is interfering with her ability to exercise and is bothersome at work.  Past medical history, Surgical history, Family history not pertinant except as noted below, Social history, Allergies, and medications have been entered into the medical record, reviewed, and no changes needed.   Review of Systems: No headache, visual changes, nausea, vomiting, diarrhea, constipation, dizziness, abdominal pain, skin rash, fevers, chills, night sweats, weight loss, swollen lymph nodes, body aches, joint swelling, muscle aches, chest pain, shortness of breath, mood changes, visual or auditory hallucinations.   Objective:    Filed Vitals:   03/19/15 0955  BP: 107/77  Pulse: 66   General: Well Developed, well nourished, and in no acute distress.  Neuro/Psych: Alert and oriented x3, extra-ocular muscles intact, able to move all 4 extremities, sensation grossly intact. Skin: Warm and dry, no rashes noted.  Respiratory: Not using accessory muscles, speaking in full sentences, trachea midline.  Cardiovascular: Pulses palpable, no extremity edema. Abdomen: Does not appear distended. MSK: Back: Nontender to spinal midline. Mildly tender to palpation bilateral SI joints. Range of motion limited by pain. Negative straight leg test, Faber test bilaterally. A positive pretzel  crossover stretch bilaterally. Lower extremity reflexes and strength are intact. Patient can rise from a seated position get on and off exam table stand on her toes and heels and squat. Sensation is intact bilateral lower extremities.  No results found for this or any previous visit (from the past 24 hour(s)). No results found.  Impression and Recommendations:   This case required medical decision making of moderate complexity. Dr

## 2015-03-27 ENCOUNTER — Ambulatory Visit (INDEPENDENT_AMBULATORY_CARE_PROVIDER_SITE_OTHER): Payer: BLUE CROSS/BLUE SHIELD | Admitting: Rehabilitative and Restorative Service Providers"

## 2015-03-27 ENCOUNTER — Encounter: Payer: Self-pay | Admitting: Rehabilitative and Restorative Service Providers"

## 2015-03-27 DIAGNOSIS — S338XXA Sprain of other parts of lumbar spine and pelvis, initial encounter: Secondary | ICD-10-CM

## 2015-03-27 DIAGNOSIS — M629 Disorder of muscle, unspecified: Secondary | ICD-10-CM

## 2015-03-27 DIAGNOSIS — S39012A Strain of muscle, fascia and tendon of lower back, initial encounter: Secondary | ICD-10-CM

## 2015-03-27 DIAGNOSIS — Z7409 Other reduced mobility: Secondary | ICD-10-CM | POA: Diagnosis not present

## 2015-03-27 DIAGNOSIS — M6289 Other specified disorders of muscle: Secondary | ICD-10-CM

## 2015-03-27 DIAGNOSIS — R531 Weakness: Secondary | ICD-10-CM

## 2015-03-27 NOTE — Patient Instructions (Signed)
Abdominal Bracing With Pelvic Floor (Hook-Lying)    With neutral spine, tighten pelvic floor and abdominals sucking belly button to back bone; tighten muscles in back at waist. Hold 10-20 sec  Repeat _10 __ times. Do _several__ times a day. Progress to do this in sitting; standing; walking and running and functional activities  Trunk: Prone Extension (Press-Ups)    Lie on stomach on firm, flat surface. Relax bottom and legs. Raise chest in air with elbows straight. Keep hips flat on surface, sag stomach. Hold 2-3____ seconds. Repeat __10 times. Do _2-3___ sessions per day. CAUTION: Movement should be gentle and slow.   Hip Flexor, Quadricep Stretch: Belly Down (Strap)    Engage stable pelvic tilt. Hold top of foot with hand or strap. Hold for _30 sec. Repeat ___3_ times each leg.  Angry Cat Stretch    Tuck chin and tighten stomach, arching back. Pause 2-3 sec  Repeat _10___ times per set. Do __2-3__ sessions per day.     Kneeling    Kneel, sitting on heels, arms forward on floor. Push chest toward floor, reaching forward as far as possible. Hold _20-30__ seconds. Repeat _3__ times per session. Do _2-3__ sessions per day.   HIP: Hamstrings - Supine    Place strap around foot. Raise leg up, keep knee straight. Hold _30__ seconds. __3_ reps per set, _2-3_times per day    Outer Hip Stretch: Reclined IT Band Stretch (Strap)    Strap around opposite foot, pull across only as far as possible with shoulders on mat. Hold for __30 sec. Repeat _3___ times each leg.

## 2015-03-27 NOTE — Therapy (Signed)
[facility] [place] [address] [place], [place], [address] Phone: [phone]   Fax:  [phone]  Physical Therapy Evaluation  Patient Details  Name: Gina Casey MRN: [id number] Date of Birth: [birth year] Referring Provider: Dr Clementeen Graham   Encounter Date: 03/27/2015      PT End of Session - 03/27/15 0906    Visit Number 1   Number of Visits 12   Date for PT Re-Evaluation 05/08/15   PT Start Time 0808   PT Stop Time 0852   PT Time Calculation (min) 44 min   Activity Tolerance Patient tolerated treatment well      Past Medical History  Diagnosis Date  . Hypothyroidism     Past Surgical History  Procedure Laterality Date  . Breast reduction surgery    . Laparoscopy for ectopic pregnancy      There were no vitals filed for this visit.  Visit Diagnosis:  Muscular imbalance - Plan: PT plan of care cert/re-cert  Lumbosacral strain, initial encounter - Plan: PT plan of care cert/re-cert  Decreased strength, endurance, and mobility - Plan: PT plan of care cert/re-cert      Subjective Assessment - 03/27/15 0807    Subjective Pt reports that she did the [place] marathon 03/04/15 and noticed that she was having pain and soreness all over and then noticed that she had soreness in Rt hip and then settled in her back. She has had soreness, stiffness, pain  and spasms in the ow back. Seen by MD 03/19/15 and has been using meds and TENS unit with some improvement. She has continued stiffness in the LB and has not returned to normal functional activities. She ran 3.1 miles last week and did OK.   Pertinent History Lt knee pain treated~2 yrs ago and Lt foot arthritis treated in the spring in boot for ~8 weeks   How long can you sit comfortably? 1-2 hr   How long can you stand comfortably? 20-30 min    How long can you walk comfortably? no limit    Patient Stated Goals get rid of stiffness; strengthening core; return to running at  same level   Currently in Pain? No/denies            [facility] PT Assessment - 03/27/15 0001    Assessment   Medical Diagnosis lumbosacral strain   Referring Provider Dr Clementeen Graham    Onset Date/Surgical Date 03/04/15   Hand Dominance Right   Next MD Visit 12/16   Prior Therapy not for LBP   Precautions   Precautions None   Balance Screen   Has the patient fallen in the past 6 months No   Has the patient had a decrease in activity level because of a fear of falling?  No   Is the patient reluctant to leave their home because of a fear of falling?  No   Home Environment   Additional Comments no problems now did have difficulty with any movement   Prior Function   Level of Independence Independent   Vocation Full time employment   Vocation Requirements at desk/computer 8 hr/day    Leisure runs training 3-5 days/wk at least 5 miles each day; household chores; cooking; Archivist on the sofa   Observation/Other Assessments   Focus on Therapeutic Outcomes (FOTO)  46% limitation    Sensation   Additional Comments WFL's per pt report   Posture/Postural Control   Posture Comments head forward; shoulders rounded and elevated; scapulae abducted  and rotated along the thoracic wall; increased thoracic kyphosis; increased lumbar lordosis   AROM   Lumbar Flexion 95%   Lumbar Extension 75%   Lumbar - Right Side Bend 90%   Lumbar - Left Side Bend 90%   Lumbar - Right Rotation 80%   Lumbar - Left Rotation 80%   Strength   Overall Strength Comments bilat LE's 5/5 with manual muscle testing - note Lt LE is smaller in thigh and calf than Rt    Flexibility   Hamstrings Rt 85 deg; Lt 74 deg    Quadriceps heel ~3 in from buttocks tight/pulling into quad and LB    ITB tight Lt    Piriformis tight Rt    Palpation   Spinal mobility LBP with PA mobs T8 - L5/sacrum    SI assessment  pain Lt SI    Palpation comment tight/discomfort Rt sacral border and Rt hip flexors                     [facility] Adult PT Treatment/Exercise - 03/27/15 0001    Self-Care   Self-Care --  education re sitting positions/surfaces & modifications   Lumbar Exercises: Stretches   Passive Hamstring Stretch 3 reps;30 seconds   Press Ups --  2 sec hold x 10   Quad Stretch 3 reps;30 seconds  foam roll under distal thigh   ITB Stretch 3 reps;30 seconds   Lumbar Exercises: Supine   AB Set Limitations 3 part core 10 sec hold x 10    Lumbar Exercises: Quadruped   Madcat/Old Horse 10 reps  2 sec pause   Madcat/Old Horse Limitations sit back - childs pose 20 sec hold x 3                PT Education - 03/27/15 0851    Education provided Yes   Education Details HEP   Person(s) Educated Patient   Methods Explanation;Demonstration;Tactile cues;Verbal cues;Handout   Comprehension Verbalized understanding;Returned demonstration;Verbal cues required;Tactile cues required             PT Long Term Goals - 03/27/15 0911    PT LONG TERM GOAL #1   Title Improve muscular length to demo more symmetric ROM/mobility bilat LE's 05/08/15   Time 6   Period Weeks   Status New   PT LONG TERM GOAL #2   Title Return to running schedule 4-5 times/wk for 3-5 miles without difficulty/flare up of symptoms 05/08/15   Time 6   Period Weeks   Status New   PT LONG TERM GOAL #3   Title Core stability allowing pt to do exercise/running without LBP 05/08/15   Time 6   Period Weeks   Status New   PT LONG TERM GOAL #4   Title I in HEP for discharge 05/08/15   Time 6   Period Weeks   Status New   PT LONG TERM GOAL #5   Title Improve FOTO to </= 30% limitation 05/08/15   Time 6   Status New               Plan - 03/27/15 0907    Clinical Impression Statement Gina Casey presents with resolving LBP after running a marathon 03/04/15. She has muscular imbalance; limited trunk and LE mobility; poor core strength; limited funcitonal endurance and functional activity level. She has not returned to full running  schedule. Pt will benefit form PY to address deficits identifited.    Pt will benefit from skilled therapeutic  intervention in order to improve on the following deficits Postural dysfunction;Improper body mechanics;Decreased mobility;Pain;Decreased endurance;Decreased activity tolerance   Rehab Potential Good   PT Frequency 2x / week   PT Duration 6 weeks   PT Treatment/Interventions Patient/family education;ADLs/Self Care Home Management;Manual techniques;Neuromuscular re-education;Dry needling;Cryotherapy;Electrical Stimulation;Moist Heat;Ultrasound;Therapeutic activities;Therapeutic exercise   PT Next Visit Plan Add pec stretch and posterior shoulder girdle strengthening(engaging core) to address posture and alignment; progress with core stabilization; address strengthening Lt LE to restore muscular balance.    PT Home Exercise Plan HEP   Consulted and Agree with Plan of Care Patient         Problem List Patient Active Problem List   Diagnosis Date Noted  . Lumbosacral strain 03/19/2015  . 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 Arthritis, midfoot 02/11/2012  . Pes cavus 02/11/2012    Jarad Barth Rober MinionP Lluvia Gwynne PT, MPH 03/27/2015, 9:17 AM  [facility] [address] [place], [place], [address] Phone: [phone]   Fax:  [phone]  Name: Gina Gammonancy Casey MRN: [id number] Date of Birth: 08-19-1975

## 2015-04-04 ENCOUNTER — Encounter: Payer: Self-pay | Admitting: Rehabilitative and Restorative Service Providers"

## 2015-04-04 ENCOUNTER — Ambulatory Visit (INDEPENDENT_AMBULATORY_CARE_PROVIDER_SITE_OTHER): Payer: BLUE CROSS/BLUE SHIELD | Admitting: Rehabilitative and Restorative Service Providers"

## 2015-04-04 DIAGNOSIS — S338XXA Sprain of other parts of lumbar spine and pelvis, initial encounter: Secondary | ICD-10-CM | POA: Diagnosis not present

## 2015-04-04 DIAGNOSIS — Z7409 Other reduced mobility: Secondary | ICD-10-CM

## 2015-04-04 DIAGNOSIS — M629 Disorder of muscle, unspecified: Secondary | ICD-10-CM

## 2015-04-04 DIAGNOSIS — S39012A Strain of muscle, fascia and tendon of lower back, initial encounter: Secondary | ICD-10-CM

## 2015-04-04 DIAGNOSIS — M6289 Other specified disorders of muscle: Secondary | ICD-10-CM

## 2015-04-04 DIAGNOSIS — R531 Weakness: Secondary | ICD-10-CM

## 2015-04-04 NOTE — Therapy (Signed)
[facility] [place] [address] [place], [place], [address] Phone: [phone]   Fax:  [phone]  Physical Therapy Treatment  Patient Details  Name: Gina Casey MRN: [id number] Date of Birth: [birth year] Referring Provider: Teressa Lower  Encounter Date: 04/04/2015      PT End of Session - 04/04/15 0704    Visit Number 2   Number of Visits 12   PT Start Time 0702   PT Stop Time 0801   PT Time Calculation (min) 59 min   Activity Tolerance Patient tolerated treatment well      Past Medical History  Diagnosis Date  . Hypothyroidism     Past Surgical History  Procedure Laterality Date  . Breast reduction surgery    . Laparoscopy for ectopic pregnancy      There were no vitals filed for this visit.  Visit Diagnosis:  Muscular imbalance  Lumbosacral strain, initial encounter  Decreased strength, endurance, and mobility      Subjective Assessment - 04/04/15 0704    Subjective Has run a few times - 5K this past weekend and did OK. Still has some tightness and pain in the Rt LB.    Currently in Pain? Yes   Pain Score 2    Pain Location Back   Pain Orientation Right   Pain Descriptors / Indicators Aching   Pain Type Acute pain   Pain Onset 1 to 4 weeks ago   Aggravating Factors  standing; posture; certain shoes   Pain Relieving Factors TENS; rest; tiger balm            [facility] PT Assessment - 04/04/15 0001    Assessment   Medical Diagnosis lumbosacral strain   Referring Provider E Denyse Amass   Onset Date/Surgical Date 03/04/15   Hand Dominance Right   Next MD Visit 12/16   Prior Therapy not for LBP   Flexibility   Hamstrings Lt 82 deg                      [facility] Adult PT Treatment/Exercise - 04/04/15 0001    Lumbar Exercises: Stretches   Passive Hamstring Stretch 2 reps;30 seconds   Press Ups --  2 sec hold x 10   Quad Stretch 2 reps;30 seconds   ITB Stretch 2 reps;30 seconds   Piriformis  Stretch 2 reps;30 seconds   Piriformis Stretch Limitations 3 way doorway stretch 30 sec hold x2 each position   Lumbar Exercises: Aerobic   Stationary Bike Nustep L4 x 5'   Lumbar Exercises: Standing   Scapular Retraction Both;20 reps;Theraband   Theraband Level (Scapular Retraction) Level 1 (Yellow)   Row Both;10 reps;Theraband   Theraband Level (Row) Level 3 (Green)   Shoulder Extension Both;10 reps;Theraband   Theraband Level (Shoulder Extension) Level 3 (Green)   Shoulder Adduction Limitations scap squeeze with noodle 10 sec x 10    Other Standing Lumbar Exercises single leg stance 30 sec x 2 Lt; fwd step up Lt x 20; lat heel touch x20 Lt; sit to stand Lt LE back x 10    Lumbar Exercises: Supine   Bridge 10 reps  10 sec    Bridge Limitations single leg bridge with Lt only 5 sec hold 2 sets of 5    Other Supine Lumbar Exercises partial curl up engaging core 2-3 sec pause x10    Lumbar Exercises: Prone   Other Prone Lumbar Exercises pelvic press 10 sec x 10    Other  Prone Lumbar Exercises pelvic press with alt ALR x10 each    Moist Heat Therapy   Number Minutes Moist Heat 15 Minutes   Moist Heat Location Lumbar Spine   Electrical Stimulation   Electrical Stimulation Location bilat L5; Lt QL   Electrical Stimulation Action IFC   Electrical Stimulation Parameters to tolerance   Electrical Stimulation Goals Pain   Manual Therapy   Manual therapy comments pt prone    Joint Mobilization CPA mobs L4/5; L5/S1; UPA L4/5 Grade 11/111                PT Education - 04/04/15 0751    Education provided Yes   Education Details posture; alignment; core stabilization; HEP    Person(s) Educated Patient   Methods Explanation;Demonstration;Tactile cues;Verbal cues;Handout   Comprehension Verbalized understanding;Returned demonstration;Verbal cues required;Tactile cues required             PT Long Term Goals - 03/27/15 0911    PT LONG TERM GOAL #1   Title Improve muscular  length to demo more symmetric ROM/mobility bilat LE's 05/08/15   Time 6   Period Weeks   Status New   PT LONG TERM GOAL #2   Title Return to running schedule 4-5 times/wk for 3-5 miles without difficulty/flare up of symptoms 05/08/15   Time 6   Period Weeks   Status New   PT LONG TERM GOAL #3   Title Core stability allowing pt to do exercise/running without LBP 05/08/15   Time 6   Period Weeks   Status New   PT LONG TERM GOAL #4   Title I in HEP for discharge 05/08/15   Time 6   Period Weeks   Status New   PT LONG TERM GOAL #5   Title Improve FOTO to </= 30% limitation 05/08/15   Time 6   Status New               Plan - 04/04/15 0801    Clinical Impression Statement Improvement with HEP and rest. Using TENS unit at home which helps with pain/discomfort. Tolerated exercses well with noted core weakness and exersion with activities. Note increase in HS flexibility; improving spinal mobility with manual work. Progressing toward stated goals.    Pt will benefit from skilled therapeutic intervention in order to improve on the following deficits Postural dysfunction;Improper body mechanics;Decreased mobility;Pain;Decreased endurance;Decreased activity tolerance   Rehab Potential Good   PT Frequency 2x / week   PT Duration 6 weeks   PT Treatment/Interventions Patient/family education;ADLs/Self Care Home Management;Manual techniques;Neuromuscular re-education;Dry needling;Cryotherapy;Electrical Stimulation;Moist Heat;Ultrasound;Therapeutic activities;Therapeutic exercise   PT Next Visit Plan core stabilization    PT Home Exercise Plan HEP   Consulted and Agree with Plan of Care Patient        Problem List Patient Active Problem List   Diagnosis Date Noted  . Lumbosacral strain 03/19/2015  . 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 Arthritis, midfoot 02/11/2012  . Pes cavus 02/11/2012    Celyn Rober MinionP Holt PT, MPH  04/04/2015, 8:04 AM  [facility] [address] [place], [place], [address] Phone: [phone]   Fax:  [phone]  Name: Gina Casey MRN: [id number] Date of Birth: 1975-05-21

## 2015-04-04 NOTE — Patient Instructions (Signed)
Piriformis Stretch    Lying on back, pull right knee toward opposite shoulder. Hold __30__ seconds. Repeat __3__ times. Do _2-3___ sessions per day.   Bridging    Slowly raise buttocks from floor, keeping core tightt. Repeat _10___ times per set. Do _1-3___ sets per session. Do _1___ sessions per day.   Bridging: with Straight Leg Raise    With legs bent, lift buttocks __10-12__ inches from floor. Then slowly extend right knee, keeping stomach tight. Repeat _10___ times per set. Do _1-3___ sets per session. Do __1__ sessions per day.   Pelvic Press    Place hands under belly between navel and pubic bone, palms up. Feel pressure on hands. Increase pressure on hands by pressing pelvis down. This is NOT a pelvic tilt. Hold ___ seconds. Relax. Repeat ___ times.   Leg Lift: One-Leg    Press pelvis down. Keep knee straight; lengthen and lift one leg (from waist). Do not twist body. Keep other leg down. Hold ___ seconds. Relax. Repeat 1 time. Repeat with other leg.    Curl-Up: Phase 2    Keeping arms folded across chest, tilt pelvis to flatten back. Raise head and shoulders from floor. Repeat __10__ times per set. Do _1-3___ sets per session. Do _1___ sessions per day.  Scapula Adduction With Pectoralis Stretch: Low - Standing   Shoulders at 45 hands even with shoulders, keeping weight through legs, shift weight forward until you feel pull or stretch through the front of your chest. Hold _30__ seconds. Do _3__ times, _2-4__ times per day.   Scapula Adduction With Pectoralis Stretch: Mid-Range - Standing   Shoulders at 90 elbows even with shoulders, keeping weight through legs, shift weight forward until you feel pull or strength through the front of your chest. Hold __30_ seconds. Do _3__ times, __2-4_ times per day.   Scapula Adduction With Pectoralis Stretch: High - Standing   Shoulders at 120 hands up high on the doorway, keeping weight on feet, shift  weight forward until you feel pull or stretch through the front of your chest. Hold _30__ seconds. Do _3__ times, _2-3__ times per day.  Forward    Facing step, place one leg on step, flexed at hip. Step up slowly, bringing hips in line with knee and shoulder. Bring other foot onto step. Reverse process to step back down. Repeat with other leg. Do __20__ repetitions, __1-3__ sets.   Lateral Step Up    Stand to side of step. Step up leading with left leg. Slowly step down leading with opposite leg. Perform _20__ reps. 1-3 sets   Stand on left leg  Sit to stand with left leg back  Shoulder Blade Squeeze    Rotate shoulders back, then squeeze shoulder blades down and back. Can use swim noodle along spine for tactile cue Hold 10 sec Repeat __10__ times. Do __several __ sessions per day.    Resisted External Rotation: in Neutral - Bilateral   PALMS UP Sit or stand, tubing in both hands, elbows at sides, bent to 90, forearms forward. Pinch shoulder blades together and rotate forearms out. Keep elbows at sides. Repeat __10__ times per set. Do _2-3___ sets per session. Do _2-3___ sessions per day.   Low Row: Standing   Face anchor, feet shoulder width apart. Palms up, pull arms back, squeezing shoulder blades together. Repeat 10__ times per set. Do 2-3__ sets per session. Do 2-3__ sessions per week. Anchor Height: Waist     Strengthening: Resisted Extension   Hold  tubing in right hand, arm forward. Pull arm back, elbow straight. Repeat _10___ times per set. Do 2-3____ sets per session. Do 2-3____ sessions per day.

## 2015-04-11 ENCOUNTER — Encounter: Payer: BLUE CROSS/BLUE SHIELD | Admitting: Rehabilitative and Restorative Service Providers"

## 2015-04-16 ENCOUNTER — Ambulatory Visit (INDEPENDENT_AMBULATORY_CARE_PROVIDER_SITE_OTHER): Payer: BLUE CROSS/BLUE SHIELD | Admitting: Rehabilitative and Restorative Service Providers"

## 2015-04-16 ENCOUNTER — Ambulatory Visit (INDEPENDENT_AMBULATORY_CARE_PROVIDER_SITE_OTHER): Payer: BLUE CROSS/BLUE SHIELD | Admitting: Family Medicine

## 2015-04-16 ENCOUNTER — Encounter: Payer: Self-pay | Admitting: Rehabilitative and Restorative Service Providers"

## 2015-04-16 ENCOUNTER — Ambulatory Visit (INDEPENDENT_AMBULATORY_CARE_PROVIDER_SITE_OTHER): Payer: BLUE CROSS/BLUE SHIELD

## 2015-04-16 ENCOUNTER — Encounter: Payer: Self-pay | Admitting: Family Medicine

## 2015-04-16 VITALS — BP 116/60 | HR 79 | Wt 153.0 lb

## 2015-04-16 DIAGNOSIS — M545 Low back pain, unspecified: Secondary | ICD-10-CM

## 2015-04-16 DIAGNOSIS — Z7409 Other reduced mobility: Secondary | ICD-10-CM

## 2015-04-16 DIAGNOSIS — S338XXA Sprain of other parts of lumbar spine and pelvis, initial encounter: Secondary | ICD-10-CM

## 2015-04-16 DIAGNOSIS — S39012A Strain of muscle, fascia and tendon of lower back, initial encounter: Secondary | ICD-10-CM

## 2015-04-16 DIAGNOSIS — R6889 Other general symptoms and signs: Secondary | ICD-10-CM

## 2015-04-16 DIAGNOSIS — M629 Disorder of muscle, unspecified: Secondary | ICD-10-CM

## 2015-04-16 DIAGNOSIS — R531 Weakness: Secondary | ICD-10-CM

## 2015-04-16 DIAGNOSIS — M6289 Other specified disorders of muscle: Secondary | ICD-10-CM

## 2015-04-16 NOTE — Progress Notes (Signed)
       Gina Casey is a 39 y.o. female who [facility] today for follow-up back pain. Patient notes bilateral low back pain. She was originally seen on November 21 for low back pain thought to be due to her sacral strain. She was referred to physical therapy and she is improved but is not pain-free. She denies any radiating pain. Pain is worse with prolonged standing and back extension. She feels better with back flexion. She is able to run about 3 miles per week which is what she wants to be doing currently.   Past Medical History  Diagnosis Date  . Hypothyroidism    Past Surgical History  Procedure Laterality Date  . Breast reduction surgery    . Laparoscopy for ectopic pregnancy     Social History  Substance Use Topics  . Smoking status: Never Smoker   . Smokeless tobacco: Not on file  . Alcohol Use: Not on file   family history is not on file.  ROS as above Medications: Current Outpatient Prescriptions  Medication Sig Dispense Refill  . amphetamine-dextroamphetamine (ADDERALL XR) 25 MG 24 hr capsule Take 50 mg by mouth every morning.    . cyclobenzaprine (FLEXERIL) 10 MG tablet Take 1 tablet (10 mg total) by mouth 3 (three) times daily as needed for muscle spasms. 30 tablet 0  . folic acid (FOLVITE) 800 MCG tablet Take 1,800 mcg by mouth daily.    Marland Kitchen. lamoTRIgine (LAMICTAL) 100 MG tablet Take 100 mg by mouth daily.    . naproxen (NAPROSYN) 500 MG tablet Take 1 tablet (500 mg total) by mouth 2 (two) times daily with a meal. 30 tablet 0  . thyroid (ARMOUR) 60 MG tablet Take 60 mg by mouth 2 (two) times daily.     No current facility-administered medications for this visit.   Allergies  Allergen Reactions  . Sulfa Antibiotics Rash and Other (See Comments)    Flu like symptoms     Exam:  BP 116/60 mmHg  Pulse 79  Wt 153 lb (69.4 kg) Gen: Well NAD Back:  Nontender to spinal midline. Nontender throughout. Normal back range of motion. Pain with extension. Positive stork test bilaterally. Hip abduction strength is 4/5 bilaterally.  Gait analysis shows dynamic knee valgus with running  No results found for this or any previous visit (from the past 24 hour(s)). No results found.   Please see individual assessment and plan sections.

## 2015-04-16 NOTE — Progress Notes (Signed)
Quick Note:  Xray looked good. No evidence of the thing we were discussing. Keep current plan. ______

## 2015-04-16 NOTE — Patient Instructions (Signed)
Thank you for coming in today. Return in 3-4 weeks. } Continue PT.  Do the hip exercises we discussed.   Step ups  Side leg raise

## 2015-04-16 NOTE — Patient Instructions (Signed)
Reverse Crunch With Crunch    Hands at head, curl legs around top of ball. Tighten abdominals, contract hamstrings, squeeze ball and raise it off floor with posterior pelvic tilt. Then raise shoulders and upper back toward ceiling. Keep head and neck neutral. Keep low and middle back on floor. Do _1-2__ sets of _5-10__ repetitions.  Rubbie Battiest.    Bridge With Knee [place]end    Lie on back, both legs on top of ball, knees slightly flexed, arms on floor. Tighten buttocks while keeping abdominals tight and raise hips _10-12__ inches off floor. Do _1-2__ sets of __5-10_ repetitions. Advanced: Do not use arms for support.   Trunk Flexion    Standing on one leg, bend forward from hips. Touch floor, then return, keeping back straight and leg bent. Hold each position __30-60__ seconds. Repeat on other leg. Do __3-4__ repetitions, __1-2__ sets.  Achilles / Gastroc, Standing    Stand, right foot behind, heel on floor and turned slightly out, leg straight, forward leg bent. Move hips forward. Hold __30_ seconds. Repeat _3__ times per session. Do _2-3__ sessions per day.   Achilles / Soleus, Standing    Stand, right foot behind, heel on floor and turned slightly out. Lower hips and bend knees. Hold __30_ seconds. Repeat __3_ times per session. Do _2-3__ sessions per day.     Balance: Unilateral - Forward Lean    Stand on left foot, hands on hips. Keeping hips level, bend forward as if to touch forehead to wall. Hold __30__ seconds. Relax. Repeat __2-3__ times per set. Do __2__ sets per session. Do __1-2__ sessions per day.

## 2015-04-16 NOTE — Therapy (Addendum)
[place], [place], [address] Phone: [phone]   Fax:  [phone]  Physical Therapy Treatment  Patient Details  Name: Gina Casey MRN: [id number] Date of Birth: 02/06/1976 Referring Provider: Georgina Snell   Encounter Date: 04/16/2015      PT End of Session - 04/16/15 0707    Visit Number 3   Number of Visits 12   Date for PT Re-Evaluation 05/08/15   PT Start Time 0703   PT Stop Time 0802   PT Time Calculation (min) 59 min   Activity Tolerance Patient tolerated treatment well      Past Medical History  Diagnosis Date  . Hypothyroidism     Past Surgical History  Procedure Laterality Date  . Breast reduction surgery    . Laparoscopy for ectopic pregnancy      There were no vitals filed for this visit.  Visit Diagnosis:  Muscular imbalance  Lumbosacral strain, initial encounter  Decreased strength, endurance, and mobility      Subjective Assessment - 04/16/15 0705    Subjective Some pain in the Rt LB with prolonged standing Saturday oterhwise no pain. Doing exercises at home without diffieculty. Some Lt hip tightness with running last week but no pain    Currently in Pain? No/denies            [facility] PT Assessment - 04/16/15 0001    Assessment   Medical Diagnosis lumbosacral strain   Referring Provider Georgina Snell    Onset Date/Surgical Date 03/04/15   Hand Dominance Right   Next MD Visit 12/16   Prior Therapy not for LBP   Observation/Other Assessments   Focus on Therapeutic Outcomes (FOTO)  30% limitation    AROM   Lumbar Flexion 95%   Lumbar Extension 80%   Lumbar - Right Side Bend 90%   Lumbar - Left Side Bend 90%   Lumbar - Right Rotation 80%   Lumbar - Left Rotation 80%   Flexibility   Hamstrings Rt 87deg; Lt 85 deg    Quadriceps heel to buttocks                      [facility] Adult PT Treatment/Exercise - 04/16/15 0001    Lumbar Exercises: Stretches    Passive Hamstring Stretch 2 reps;30 seconds   Press Ups --  2 sec hold x 10   Quad Stretch 2 reps;30 seconds   ITB Stretch 2 reps;30 seconds   Piriformis Stretch 2 reps;30 seconds   Piriformis Stretch Limitations 3 way doorway stretch 30 sec hold x2 each position   Lumbar Exercises: Aerobic   Tread Mill 2.0 to 4.0 mph walk to run    Lumbar Exercises: Supine   Bridge 10 reps  10 sec with ball adductor squeeze    Bridge Limitations single leg bridge with Lt only 5 sec hold 2 sets of 5   bridge with large therapy ball; Lt LE bridge with lg ball    Other Supine Lumbar Exercises partial curl up engaging core 2-3 sec pause x10                 PT Education - 04/16/15 0748    Education provided Yes   Education Details HEP   Person(s) Educated Patient   Methods Explanation;Demonstration;Tactile cues;Verbal cues;Handout   Comprehension Verbalized understanding;Returned demonstration;Verbal cues required;Tactile cues required             PT Long Term Goals - 04/16/15 0751  PT LONG TERM GOAL #1   Title Improve muscular length to demo more symmetric ROM/mobility bilat LE's 05/08/15   Time 6   Period Weeks   Status Achieved   PT LONG TERM GOAL #2   Title Return to running schedule 4-5 times/wk for 3-5 miles without difficulty/flare up of symptoms 05/08/15   Time 6   Period Weeks   Status Achieved   PT LONG TERM GOAL #3   Title Core stability allowing pt to do exercise/running without LBP 05/08/15   Time 6   Period Weeks   Status Achieved   PT LONG TERM GOAL #4   Title I in HEP for discharge 05/08/15   Time 6   Period Weeks   Status Achieved   PT LONG TERM GOAL #5   Title Improve FOTO to </= 30% limitation 05/08/15   Time 6   Period Weeks   Status Achieved               Plan - 04/16/15 0715    Clinical Impression Statement Good improvement in LBP with treatment and HEP. Progressing with core stabilization; stretching and strengthening. Addressing strength  and girth difference Lt LE compared to Rt. Korryn has done some running but not distances 5K. She will continue with progressive resistive exercise and increasing running program as toleranted.    Pt will benefit from skilled therapeutic intervention in order to improve on the following deficits Postural dysfunction;Improper body mechanics;Decreased mobility;Pain;Decreased endurance;Decreased activity tolerance   Rehab Potential Good   PT Frequency 2x / week   PT Duration 6 weeks   PT Treatment/Interventions Patient/family education;ADLs/Self Care Home Management;Manual techniques;Neuromuscular re-education;Dry needling;Cryotherapy;Electrical Stimulation;Moist Heat;Ultrasound;Therapeutic activities;Therapeutic exercise   PT Next Visit Plan core stabilization; Lt LE strengthening    PT Home Exercise Plan HEP   Consulted and Agree with Plan of Care Patient        Problem List Patient Active Problem List   Diagnosis Date Noted  . Lumbosacral strain 03/19/2015  . 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 Arthritis, midfoot 02/11/2012  . Pes cavus 02/11/2012    Royalty Fakhouri Nilda Simmer PT, MPH  04/16/2015, 8:20 AM  [facility] [place], [place], [address] Phone: [phone]   Fax:  [phone]  Name: Gina Casey MRN: [id number] Date of Birth: 1975-08-25    PHYSICAL THERAPY DISCHARGE SUMMARY  Visits from Start of Care: 3  Current functional level related to goals / functional outcomes: Returned to running and normal functional activities without pain    Remaining deficits: Lt LE calf girth smaller than Rt    Education / Equipment: HEP Plan: Patient agrees to discharge.  Patient goals were met. Patient is being discharged due to meeting the stated rehab goals.  ?????   Connery Shiffler P. Helene Kelp PT, MPH 04/16/2015 8:22 AM

## 2015-04-16 NOTE — Assessment & Plan Note (Signed)
Concern for pars defect based on history. Continue physical therapy. Obtain x-ray of L-spine. Work on hip abduction strength. Return in a few weeks.

## 2015-05-17 ENCOUNTER — Ambulatory Visit (INDEPENDENT_AMBULATORY_CARE_PROVIDER_SITE_OTHER): Payer: BLUE CROSS/BLUE SHIELD | Admitting: Family Medicine

## 2015-05-17 ENCOUNTER — Encounter: Payer: Self-pay | Admitting: Family Medicine

## 2015-05-17 VITALS — BP 122/86 | HR 84 | Wt 149.0 lb

## 2015-05-17 DIAGNOSIS — M545 Low back pain, unspecified: Secondary | ICD-10-CM

## 2015-05-17 NOTE — Progress Notes (Signed)
       Gina Casey is a 40 y.o. female who presents to [facility] today for low back pain. Patient is doing much better regarding her back pain. She's been doing home exercises Pilates and strength training. Her back pain as vanished. She no longer takes medications for it. She denies any radiating pain weakness or numbness.   Past Medical History  Diagnosis Date  . Hypothyroidism    Past Surgical History  Procedure Laterality Date  . Breast reduction surgery    . Laparoscopy for ectopic pregnancy     Social History  Substance Use Topics  . Smoking status: Never Smoker   . Smokeless tobacco: Not on file  . Alcohol Use: Not on file   family history is not on file.  ROS as above Medications: Current Outpatient Prescriptions  Medication Sig Dispense Refill  . amphetamine-dextroamphetamine (ADDERALL XR) 25 MG 24 hr capsule Take 50 mg by mouth every morning.    . cyclobenzaprine (FLEXERIL) 10 MG tablet Take 1 tablet (10 mg total) by mouth 3 (three) times daily as needed for muscle spasms. 30 tablet 0  . folic acid (FOLVITE) 800 MCG tablet Take 1,800 mcg by mouth daily.    Marland Kitchen lamoTRIgine (LAMICTAL) 100 MG tablet Take 100 mg by mouth daily.    . naproxen (NAPROSYN) 500 MG tablet Take 1 tablet (500 mg total) by mouth 2 (two) times daily with a meal. 30 tablet 0  . thyroid (ARMOUR) 60 MG tablet Take 60 mg by mouth 2 (two) times daily.     No current facility-administered medications for this visit.   Allergies  Allergen Reactions  . Sulfa Antibiotics Rash and Other (See Comments)    Flu like symptoms     Exam:  BP 122/86 mmHg  Pulse 84  Wt 149 lb (67.586 kg) Gen: Well NAD Gait: Normal  No results found for this or any previous visit (from the past 24 hour(s)). No results found.   Please see individual assessment and plan sections.

## 2015-05-17 NOTE — Patient Instructions (Signed)
Thank you for coming in today.   Return as needed.  Continue the exercises.  

## 2015-05-17 NOTE — Assessment & Plan Note (Signed)
Return as needed. Continue home exercises

## 2016-07-19 IMAGING — CR DG LUMBAR SPINE COMPLETE 4+V
5 series · 5 of 5 positions shown · non-contrast
Comparison: None.

CLINICAL DATA: Low back pain since [REDACTED]. Evaluate for
spondylolysis.

EXAM:
LUMBAR SPINE - COMPLETE 4+ VIEW

[l-spine ap]
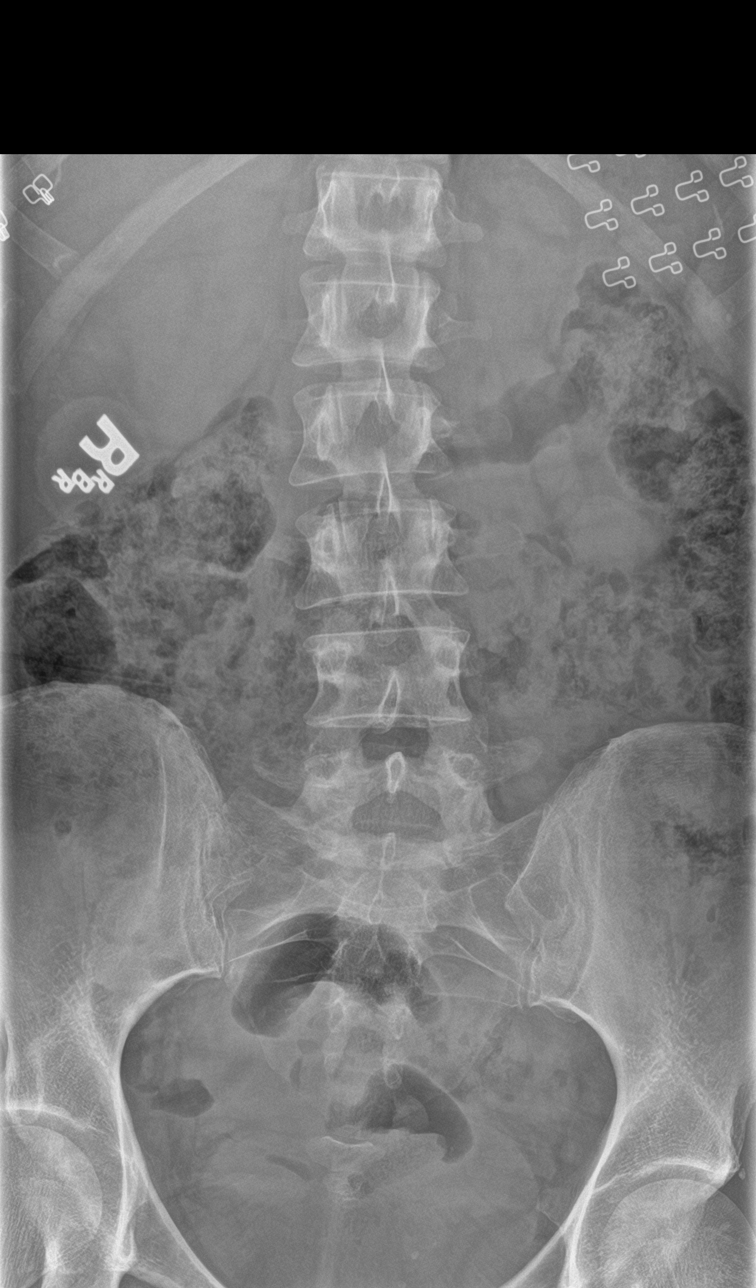

[l-spine obl (1 of 2)]
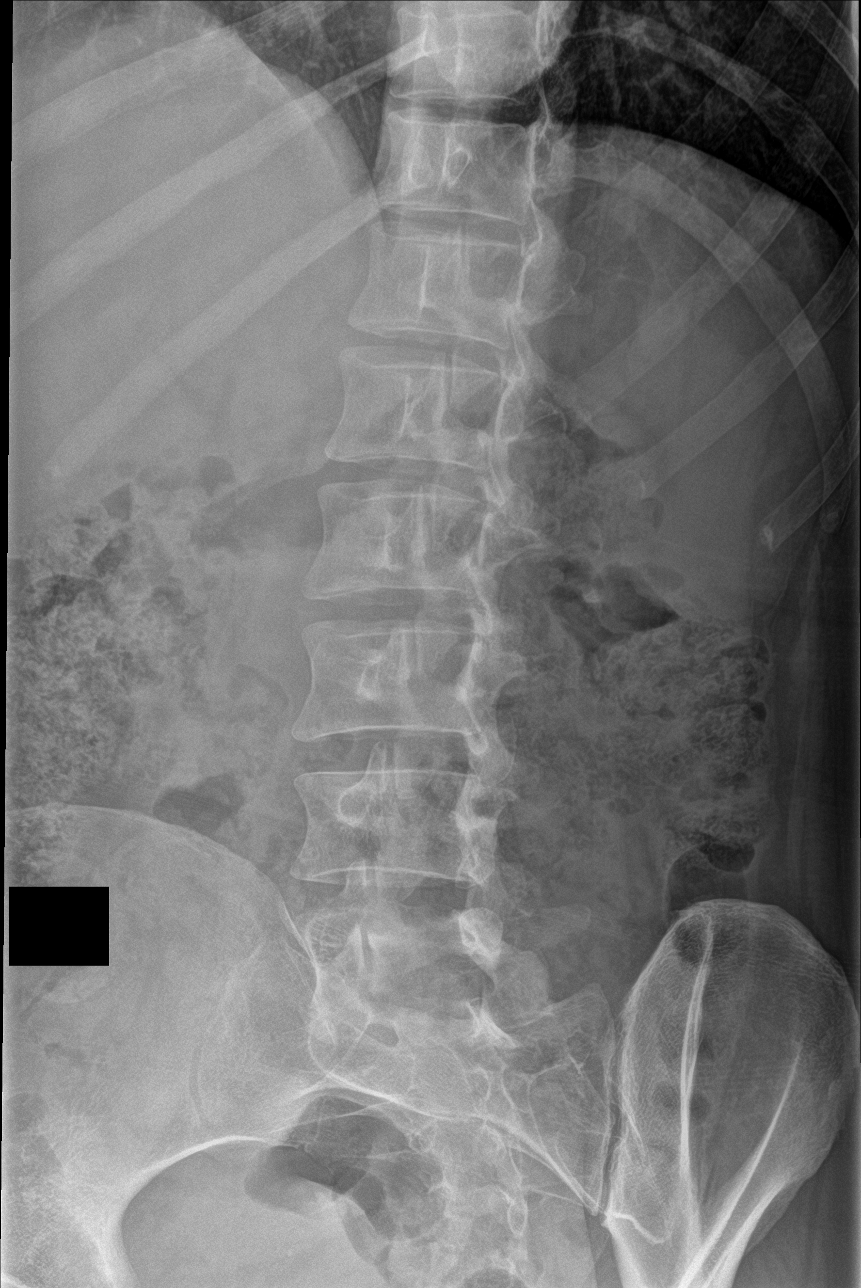

[l-spine obl (2 of 2)]
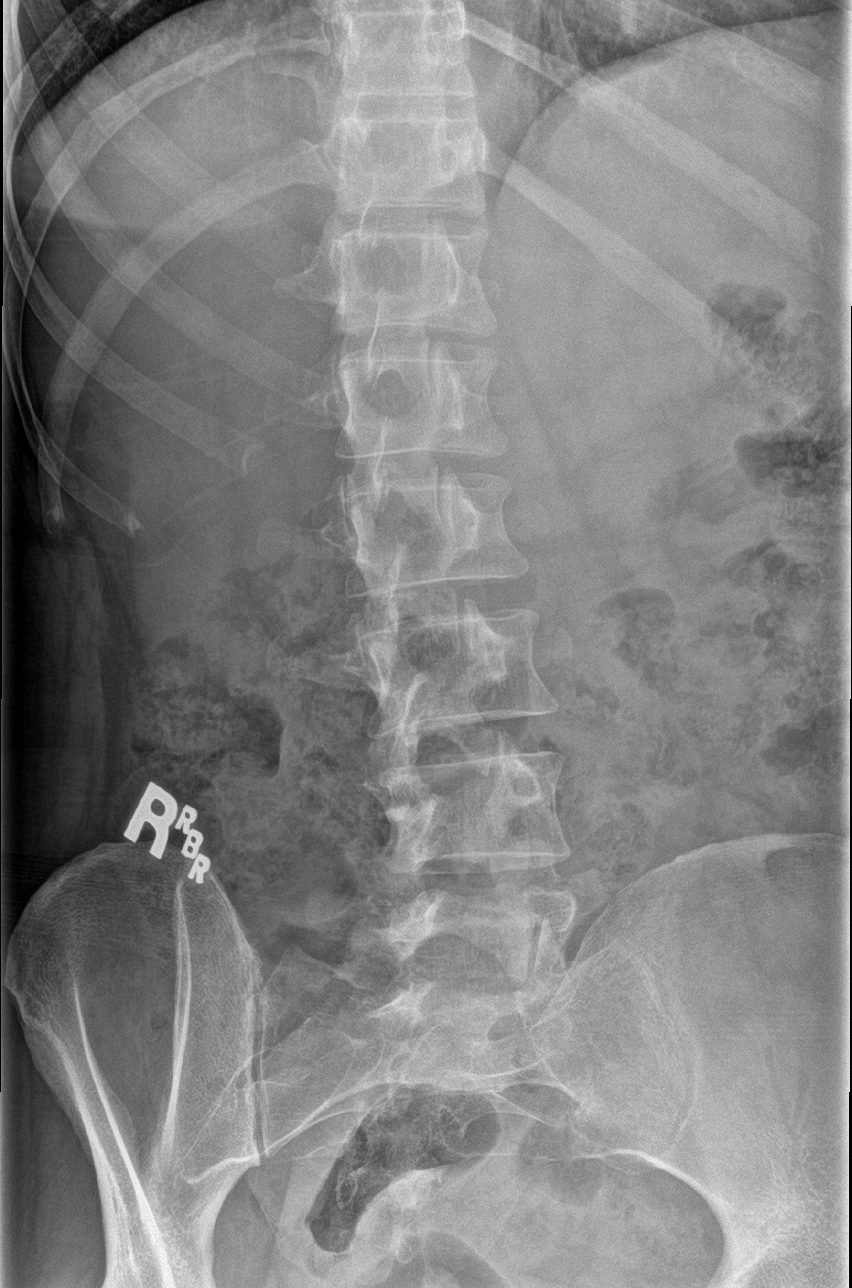

[l-spine lat]
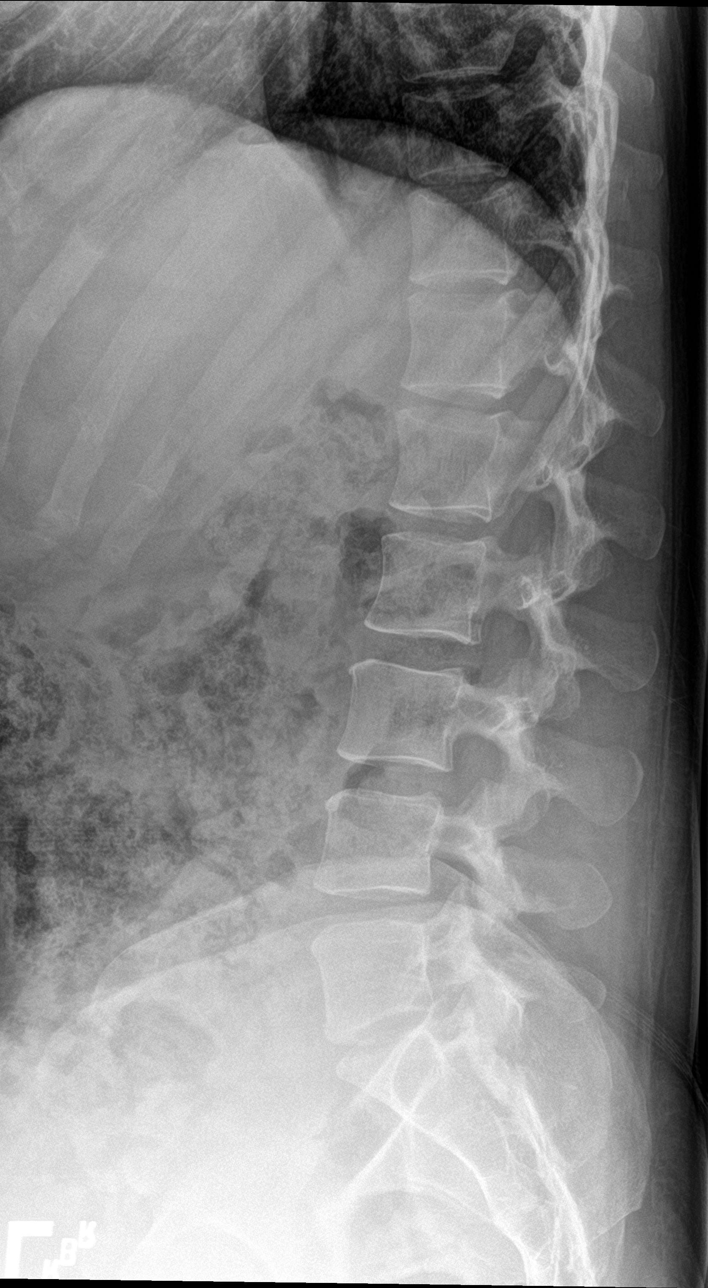

[l-spine spot]
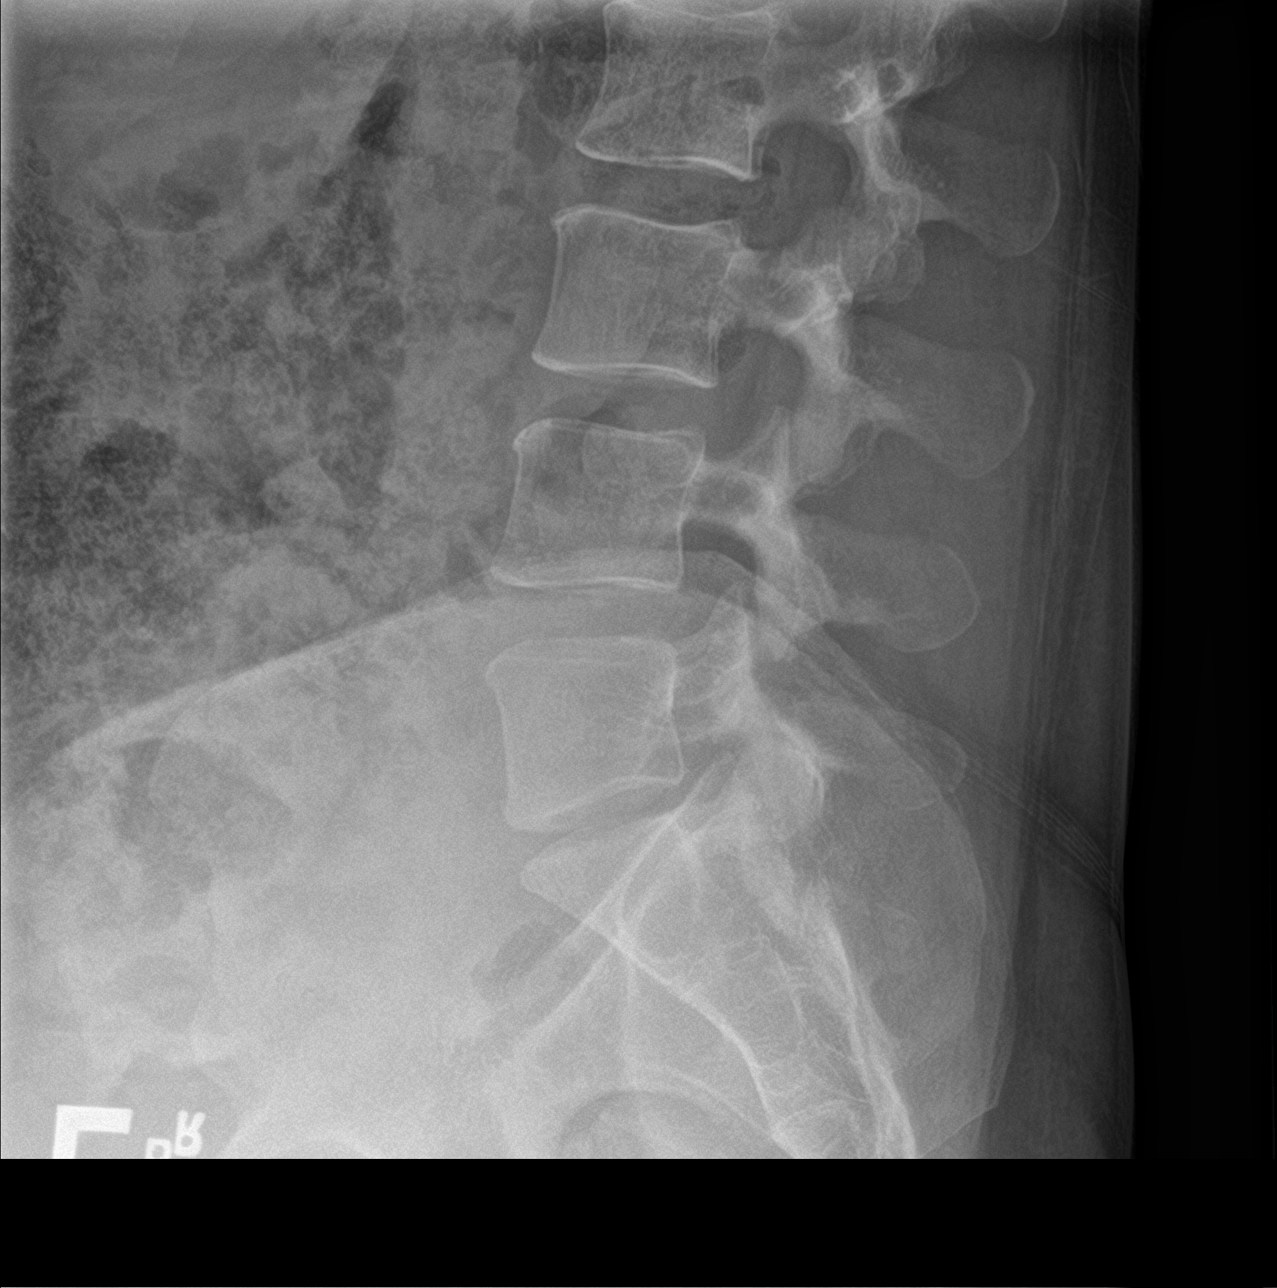

[5 of 5 positions shown; findings below may reference images not displayed]

FINDINGS: Six non rib-bearing lumbar type vertebral bodies. For the purposes
of this study, these will be labeled T12 through L5. Minimal convex
right lumbar spine curvature. Sacroiliac joints are symmetric.
Maintenance of vertebral body height and alignment. Intervertebral
disc heights are maintained.
IMPRESSION: No acute osseous abnormality.

## 2016-08-11 DIAGNOSIS — G4733 Obstructive sleep apnea (adult) (pediatric): Secondary | ICD-10-CM

## 2016-08-11 DIAGNOSIS — Z9989 Dependence on other enabling machines and devices: Secondary | ICD-10-CM

## 2016-08-11 HISTORY — DX: Obstructive sleep apnea (adult) (pediatric): G47.33

## 2018-04-06 ENCOUNTER — Ambulatory Visit (INDEPENDENT_AMBULATORY_CARE_PROVIDER_SITE_OTHER): Payer: 59 | Admitting: Family Medicine

## 2018-04-06 ENCOUNTER — Encounter: Payer: Self-pay | Admitting: Family Medicine

## 2018-04-06 VITALS — BP 121/74 | HR 70 | Ht 64.5 in | Wt 148.0 lb

## 2018-04-06 DIAGNOSIS — G8929 Other chronic pain: Secondary | ICD-10-CM | POA: Diagnosis not present

## 2018-04-06 DIAGNOSIS — F319 Bipolar disorder, unspecified: Secondary | ICD-10-CM

## 2018-04-06 DIAGNOSIS — S39012A Strain of muscle, fascia and tendon of lower back, initial encounter: Secondary | ICD-10-CM | POA: Diagnosis not present

## 2018-04-06 DIAGNOSIS — M545 Low back pain: Secondary | ICD-10-CM | POA: Diagnosis not present

## 2018-04-06 HISTORY — DX: Bipolar disorder, unspecified: F31.9

## 2018-04-06 NOTE — Patient Instructions (Signed)
Thank you for coming in today. Attend Physical Therapy.  Use a heating pad and TENS unit.  Recheck in 6 weeks or sooner if needed.   Work on [employer] examin[employer] strength.   Ok to try running when feeling better advance about 10% per week.    Lumbosacral Strain Lumbosacral strain is an injury that causes pain in the lower back (lumbosacral spine). This injury usually occurs from overstretching the muscles or ligaments along your spine. A strain can affect one or more muscles or cord-like tissues that connect bones to other bones (ligaments). What are the causes? This condition may be caused by:  A hard, direct hit (blow) to the back.  Excessive stretching of the lower back muscles. This may result from: ? A fall. ? Lifting something heavy. ? Repetitive movements such as bending or crouching.  What increases the risk? The following factors may increase your risk of getting this condition:  Participating in sports or activities that involve: ? A sudden twist of the back. ? Pushing or pulling motions.  Being overweight or obese.  Having poor strength and flexibility, especially tight hamstrings or weak muscles in the back or abdomen.  Having too much of a curve in the lower back.  Having a pelvis that is tilted forward.  What are the signs or symptoms? The main symptom of this condition is pain in the lower back, at the site of the strain. Pain may extend (radiate) down one or both legs. How is this diagnosed? This condition is diagnosed based on:  Your symptoms.  Your medical history.  A physical exam. ? Your health care provider may push on certain areas of your back to determine the source of your pain. ? You may be asked to bend forward, backward, and side to side to assess the severity of your pain and your range of motion.  Imaging tests, such as: ? X-rays. ? MRI.  How is this treated? Treatment for this condition may include:  Putting heat and cold on the affected  area.  Medicines to help relieve pain and relax your muscles (muscle relaxants).  NSAIDs to help reduce swelling and discomfort.  When your symptoms improve, it is important to gradually return to your normal routine as soon as possible to reduce pain, avoid stiffness, and avoid loss of muscle strength. Generally, symptoms should improve within 6 weeks of treatment. However, recovery time varies. Follow these instructions at home: Managing pain, stiffness, and swelling   If directed, put ice on the injured area during the first 24 hours after your strain. ? Put ice in a plastic bag. ? Place a towel between your skin and the bag. ? Leave the ice on for 20 minutes, 2-3 times a day.  If directed, put heat on the affected area as often as told by your health care provider. Use the heat source that your health care provider recommends, such as a moist heat pack or a heating pad. ? Place a towel between your skin and the heat source. ? Leave the heat on for 20-30 minutes. ? Remove the heat if your skin turns bright red. This is especially important if you are unable to feel pain, heat, or cold. You may have a greater risk of getting burned. Activity  Rest and return to your normal activities as told by your health care provider. Ask your health care provider what activities are safe for you.  Avoid activities that take a lot of energy for as long as told  by your health care provider. General instructions  Take over-the-counter and prescription medicines only as told by your health care provider.  Donot drive or use heavy machinery while taking prescription pain medicine.  Do not use any products that contain nicotine or tobacco, such as cigarettes and e-cigarettes. If you need help quitting, ask your health care provider.  Keep all follow-up visits as told by your health care provider. This is important. How is this prevented?  Use correct form when playing sports and lifting heavy  objects.  Use good posture when sitting and standing.  Maintain a healthy weight.  Sleep on a mattress with medium firmness to support your back.  Be safe and responsible while being active to avoid falls.  Do at least 150 minutes of moderate-intensity exercise each week, such as brisk walking or water aerobics. Try a form of exercise that takes stress off your back, such as swimming or stationary cycling.  Maintain physical fitness, including: ? Strength. ? Flexibility. ? Cardiovascular fitness. ? Endurance. Contact a health care provider if:  Your back pain does not improve after 6 weeks of treatment.  Your symptoms get worse. Get help right away if:  Your back pain is severe.  You cannot stand or walk.  You have difficulty controlling when you urinate or when you have a bowel movement.  You feel nauseous or you vomit.  Your feet get very cold.  You have numbness, tingling, weakness, or problems using your arms or legs.  You develop any of the following: ? Shortness of breath. ? Dizziness. ? Pain in your legs. ? Weakness in your buttocks or legs. ? Discoloration of the skin on your toes or legs. This information is not intended to replace advice given to you by your health care provider. Make sure you discuss any questions you have with your health care provider. Document Released: 01/22/2005 Document Revised: 11/02/2015 Document Reviewed: 09/16/2015 Elsevier Interactive Patient Education  [employer].

## 2018-04-06 NOTE — Progress Notes (Signed)
Gina Casey is a 42 y.o. female who presents to [facility] today for right-sided low back pain.  Gina Casey is a healthy 42 year old woman who runs recreationally.  I had seen her previously in 2017 in late 2016 for low back pain and she did quite well with physical therapy.  She was doing well until about a year ago.  In January 2019 she completed a marathon.  After that she developed some low back pain mostly on her right lower back.  This continued to bother her especially with running.  After few months of bothersome pain she sought care with a chiropractor.  This helped some but she is slowly been worsening.  She is gotten to the point now where she is able to walk with occasional run walking using a back brace but notes that that is been worsening even more recently.  She is got to the point over the last few weeks where she cannot run at all and notes this is very obnoxious and problematic.  She notes pain located in her low back typically worse with activity but sometimes present at rest.  She denies any radiating pain weakness or numbness fevers or chills.  She is tried ibuprofen and Aleve and TENS unit and heating pad which have helped a bit.  She takes medications listed below.  No fevers or chills.  Patient notes that she has had some x-rays at her chiropractor's office where no fracture was found.   ROS:  As above  Exam:  BP 121/74   Pulse 70   Ht 5' 4.5" (1.638 m)   Wt 148 lb (67.1 kg)   BMI 25.01 kg/m  General: Well Developed, well nourished, and in no acute distress.  Neuro/Psych: Alert and oriented x3, extra-ocular muscles intact, able to move all 4 extremities, sensation grossly intact. Skin: Warm and dry, no rashes noted.  Respiratory: Not using accessory muscles, speaking in full sentences, trachea midline.  Cardiovascular: Pulses palpable, no extremity edema. Abdomen: Does not appear distended. MSK:  L-spine  normal-appearing. Nontender spinal midline. Tender palpation right SI joint. Low back motion is normal. Lower extremity strength is intact with the exception of mildly reduced hip abduction strength 4+/5 bilaterally. Negative stork test bilaterally. Free motion and SI joints without pain bilaterally. Poor perispinal muscular engagement with prone extension activities  Hips bilaterally normal-appearing nontender normal motion.  Hip abduction strength slightly diminished 4+/5 bilaterally. Normal gait.  Leg lengths equal bilaterally.     Lab and Radiology Results EXAM: LUMBAR SPINE - COMPLETE 4+ VIEW  COMPARISON:  None.  FINDINGS: Six non rib-bearing lumbar type vertebral bodies. For the purposes of this study, these will be labeled T12 through L5. Minimal convex right lumbar spine curvature. Sacroiliac joints are symmetric. Maintenance of vertebral body height and alignment. Intervertebral disc heights are maintained.  IMPRESSION: No acute osseous abnormality.   Electronically Signed   By: Jeronimo Greaves M.D.   On: 04/16/2015 09:02 I personally (independently) visualized and performed the interpretation of the images attached in this note.     Assessment and Plan: 42 y.o. female with  Right-sided low back pain likely myofascial pain and dysfunction.  She may have a component of SI joint pain or dysfunction or facet joint pain.  Plan for trial of formal physical therapy.  Recheck in 6 weeks if not improving neck step would be x-ray and MRI for potential facet injection or SI injection planning. Return sooner if needed.  I  spent 25 minutes with this patient, greater than 50% was face-to-face time counseling regarding differential diagnosis treatment plan and backup plan..  CC: Jonnie KindSaunders, Weston W, MD  Orders Placed This Encounter  Procedures  . Ambulatory referral to Physical Therapy    Referral Priority:   Routine    Referral Type:   Physical Medicine     Referral Reason:   Specialty Services Required    Requested Specialty:   Physical Therapy    Number of Visits Requested:   1   No orders of the defined types were placed in this encounter.   Historical information moved to improve visibility of documentation.  Past Medical History:  Diagnosis Date  . Bipolar disorder (HCC) 04/06/2018  . Hypothyroidism   . 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 OSA on CPAP 08/11/2016   01/2013 PSG RDI 9.6 Sat 91% Average Sat 97% using CPAP @ 11 cm of water   Past Surgical History:  Procedure Laterality Date  . BREAST REDUCTION SURGERY    . LAPAROSCOPY FOR ECTOPIC PREGNANCY     Social History   Tobacco Use  . Smoking status: Never Smoker  . Smokeless tobacco: Never Used  Substance Use Topics  . Alcohol use: Not on file   family history is not on file.  Medications: Current Outpatient Medications  Medication Sig Dispense Refill  . amphetamine-dextroamphetamine (ADDERALL XR) 25 MG 24 hr capsule Take 50 mg by mouth every morning.    . fluticasone (FLONASE) 50 MCG/ACT nasal spray     . naproxen (NAPROSYN) 500 MG tablet Take 1 tablet (500 mg total) by mouth 2 (two) times daily with a meal. 30 tablet 0  . progesterone (PROMETRIUM) 100 MG capsule progesterone micronized 100 mg capsule    . tretinoin (RETIN-A) 0.025 % cream APPLY TOPICALLY NIGHTLY AS DIRECTED    . buPROPion (WELLBUTRIN XL) 300 MG 24 hr tablet Take by mouth.    . cevimeline (EVOXAC) 30 MG capsule cevimeline 30 mg capsule    . lamoTRIgine (LAMICTAL) 200 MG tablet     . lithium carbonate (LITHOBID) 300 MG CR tablet     . Methylfol-Methylcob-Acetylcyst (CEREFOLIN NAC PO) Take by mouth.    . sertraline (ZOLOFT) 100 MG tablet     . Thyroid (NATURE-THROID) 81.25 MG TABS Nature-Throid 81.25 mg tablet    . Vilazodone HCl (VIIBRYD) 40 MG TABS Viibryd 40 mg tablet    . zolpidem (AMBIEN CR) 6.25 MG CR tablet Ambien CR 6.25 mg  tablet,extended release  Take 1 tablet every day by oral route.     No current facility-administered medications for this visit.    Allergies  Allergen Reactions  . Sulfa Antibiotics Rash and Other (See Comments)    Flu like symptoms      Discussed warning signs or symptoms. Please see discharge instructions. Patient expresses understanding.

## 2018-04-06 NOTE — Progress Notes (Signed)
bac

## 2018-05-21 ENCOUNTER — Encounter: Payer: Self-pay | Admitting: Family Medicine

## 2018-05-21 ENCOUNTER — Ambulatory Visit (INDEPENDENT_AMBULATORY_CARE_PROVIDER_SITE_OTHER): Payer: 59 | Admitting: Family Medicine

## 2018-05-21 VITALS — BP 126/82 | HR 85 | Ht 64.5 in | Wt 150.0 lb

## 2018-05-21 DIAGNOSIS — S39012A Strain of muscle, fascia and tendon of lower back, initial encounter: Secondary | ICD-10-CM

## 2018-05-21 NOTE — Progress Notes (Signed)
Gina Casey is a 43 y.o. female who presents to [facility] today for follow-up back pain.  Gina Casey was seen on December 10 after having back pain for months.  She had some initial treatment with chiropractor with reportedly negative x-rays.  Upon presentation I thought that the majority of her pain was due to myofascial spasm and core weakness.  Proceeded with physical therapy and patient has had significant improvement.  She is only had 2 or 3 sessions with physical therapy due to some scheduling difficulties but has been completing home exercise program.  She thinks that she is a lot better.  She is now able to walk normally without pain or wearing a brace.  She is restarted running a bit as well.  She is able to run a mile without hurting.  Her goal is to run a marathon in October.  She has completed marathons previously.    ROS:  As above  Exam:  BP 126/82   Pulse 85   Ht 5' 4.5" (1.638 m)   Wt 150 lb (68 kg)   BMI 25.35 kg/m  Wt Readings from Last 5 Encounters:  05/21/18 150 lb (68 kg)  04/06/18 148 lb (67.1 kg)  05/17/15 149 lb (67.6 kg)  04/16/15 153 lb (69.4 kg)  03/19/15 149 lb (67.6 kg)   General: Well Developed, well nourished, and in no acute distress.  Neuro/Psych: Alert and oriented x3, extra-ocular muscles intact, able to move all 4 extremities, sensation grossly intact. Skin: Warm and dry, no rashes noted.  Respiratory: Not using accessory muscles, speaking in full sentences, trachea midline.  Cardiovascular: Pulses palpable, no extremity edema. Abdomen: Does not appear distended. MSK: L-spine nontender to midline normal back motion to flexion rotation and lateral flexion.  Slight decreased range of motion to extension without significant Lower extremity strength is intact. Leg length equal bilaterally. Normal gait.    Lab and Radiology Results No results found for this or any previous visit (from the past 72  hour(s)). No results found.     Assessment and Plan: 43 y.o. female with low back pain: Significant improvement with home exercise program.  Continue physical therapy and home exercise program.  Recheck as needed.  Discussed precautions.  I spent 15 minutes with this patient, greater than 50% was face-to-face time counseling regarding treatment plan options and precautions..    Historical information moved to improve visibility of documentation.  Past Medical History:  Diagnosis Date  . Bipolar disorder (HCC) 04/06/2018  . Hypothyroidism   . 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 OSA on CPAP 08/11/2016   01/2013 PSG RDI 9.6 Sat 91% Average Sat 97% using CPAP @ 11 cm of water   Past Surgical History:  Procedure Laterality Date  . BREAST REDUCTION SURGERY    . LAPAROSCOPY FOR ECTOPIC PREGNANCY     Social History   Tobacco Use  . Smoking status: Never Smoker  . Smokeless tobacco: Never Used  Substance Use Topics  . Alcohol use: Not on file   family history is not on file.  Medications: Current Outpatient Medications  Medication Sig Dispense Refill  . amphetamine-dextroamphetamine (ADDERALL XR) 25 MG 24 hr capsule Take 50 mg by mouth every morning.    Marland Kitchen buPROPion (WELLBUTRIN XL) 300 MG 24 hr tablet Take by mouth.    . cevimeline (EVOXAC) 30 MG capsule cevimeline 30 mg capsule    . fluticasone (FLONASE) 50  MCG/ACT nasal spray     . lamoTRIgine (LAMICTAL) 200 MG tablet     . lithium carbonate (LITHOBID) 300 MG CR tablet     . Methylfol-Methylcob-Acetylcyst (CEREFOLIN NAC PO) Take by mouth.    . naproxen (NAPROSYN) 500 MG tablet Take 1 tablet (500 mg total) by mouth 2 (two) times daily with a meal. 30 tablet 0  . progesterone (PROMETRIUM) 100 MG capsule progesterone micronized 100 mg capsule    . sertraline (ZOLOFT) 100 MG tablet     . Thyroid (NATURE-THROID) 81.25 MG TABS Nature-Throid 81.25 mg tablet    . tretinoin  (RETIN-A) 0.025 % cream APPLY TOPICALLY NIGHTLY AS DIRECTED    . Vilazodone HCl (VIIBRYD) 40 MG TABS Viibryd 40 mg tablet    . zolpidem (AMBIEN CR) 6.25 MG CR tablet Ambien CR 6.25 mg tablet,extended release  Take 1 tablet every day by oral route.     No current facility-administered medications for this visit.    Allergies  Allergen Reactions  . Sulfa Antibiotics Rash and Other (See Comments)    Flu like symptoms      Discussed warning signs or symptoms. Please see discharge instructions. Patient expresses understanding.

## 2018-05-21 NOTE — Patient Instructions (Signed)
Thank you for coming in today. Continue home exercises and PT.  If getting better ok to just continue.  Recheck as needed.  If something happens or you fail to improve enough recheck.,

## 2021-04-03 ENCOUNTER — Other Ambulatory Visit: Payer: Self-pay

## 2021-04-03 ENCOUNTER — Ambulatory Visit: Payer: 59 | Admitting: Sports Medicine

## 2021-04-03 ENCOUNTER — Ambulatory Visit (INDEPENDENT_AMBULATORY_CARE_PROVIDER_SITE_OTHER): Payer: 59

## 2021-04-03 DIAGNOSIS — R635 Abnormal weight gain: Secondary | ICD-10-CM | POA: Diagnosis not present

## 2021-04-03 DIAGNOSIS — M545 Low back pain, unspecified: Secondary | ICD-10-CM

## 2021-04-03 DIAGNOSIS — G8929 Other chronic pain: Secondary | ICD-10-CM

## 2021-04-03 MED ORDER — PREDNISONE 50 MG PO TABS: ORAL_TABLET | ORAL | 0 refills | Status: DC

## 2021-04-03 NOTE — Assessment & Plan Note (Addendum)
Has had maybe a 20 pound weight gain over the past several months, certainly this affects her lumbar spine pain. She will work on a low carbohydrate diet, increased aerobic exercise, and if insufficient weight loss, 5 to 10 pounds over the next 6 weeks we will consider the addition of phentermine. Goal 130-135lbs.

## 2021-04-03 NOTE — Assessment & Plan Note (Signed)
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 She notes worsening pain with sitting, flexion, Valsalva, nothing overtly radicular, no red flag symptoms. We discussed the anatomy and evolutionary anthropology of lumbar degenerative disc disease, we will get lumbar spine x-rays, home core conditioning exercises, 5 days of prednisone. Return to see me in approximately 6 weeks, MRI for interventional planning if no better.

## 2021-04-03 NOTE — Progress Notes (Signed)
    Procedures performed today:    None.  Independent interpretation of notes and tests performed by another provider:   None.  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 She notes worsening pain with sitting, flexion, Valsalva, nothing overtly radicular, no red flag symptoms. We discussed the anatomy and evolutionary anthropology of lumbar degenerative disc disease, we will get lumbar spine x-rays, home core conditioning exercises, 5 days of prednisone. Return to see me in approximately 6 weeks, MRI for interventional planning if no better.  Abnormal weight gain Has had maybe a 20 pound weight gain over the past several months, certainly this affects her lumbar spine pain. She will work on a low carbohydrate diet, increased aerobic exercise, and if insufficient weight loss, 5 to 10 pounds over the next 6 weeks we will consider the addition of phentermine. Goal 130-135lbs.  Chronic process with exacerbation and pharmacologic intervention  ___________________________________________ Ihor Austin. Benjamin Stain, M.D., ABFM., CAQSM. Primary Care and Sports Medicine [facility]  Adjunct Instructor of Family Medicine  [employer]

## 2021-05-15 ENCOUNTER — Ambulatory Visit: Payer: BC Managed Care – PPO | Admitting: Sports Medicine

## 2023-12-31 ENCOUNTER — Encounter: Payer: Self-pay | Admitting: Sports Medicine

## 2024-05-01 ENCOUNTER — Ambulatory Visit: Admission: RE | Admit: 2024-05-01 | Discharge: 2024-05-01 | Disposition: A | Discharge status: Home / Self Care

## 2024-05-01 VITALS — BP 112/73 | HR 66 | Temp 98.3°F | Resp 18

## 2024-05-01 DIAGNOSIS — R058 Other specified cough: Secondary | ICD-10-CM | POA: Diagnosis not present

## 2024-05-01 DIAGNOSIS — B9689 Other specified bacterial agents as the cause of diseases classified elsewhere: Secondary | ICD-10-CM | POA: Diagnosis not present

## 2024-05-01 DIAGNOSIS — J029 Acute pharyngitis, unspecified: Secondary | ICD-10-CM

## 2024-05-01 DIAGNOSIS — R519 Headache, unspecified: Secondary | ICD-10-CM

## 2024-05-01 DIAGNOSIS — J069 Acute upper respiratory infection, unspecified: Secondary | ICD-10-CM | POA: Diagnosis not present

## 2024-05-01 DIAGNOSIS — J019 Acute sinusitis, unspecified: Secondary | ICD-10-CM | POA: Diagnosis not present

## 2024-05-01 MED ORDER — DEXAMETHASONE 6 MG PO TABS: 6.0000 mg | ORAL_TABLET | Freq: Two times a day (BID) | ORAL | 0 refills | Status: AC

## 2024-05-01 MED ORDER — AMOXICILLIN-POT CLAVULANATE 875-125 MG PO TABS: 1.0000 | ORAL_TABLET | Freq: Two times a day (BID) | ORAL | 0 refills | Status: AC

## 2024-05-01 MED ORDER — PROMETHAZINE-DM 6.25-15 MG/5ML PO SYRP: 5.0000 mL | ORAL_SOLUTION | Freq: Four times a day (QID) | ORAL | 0 refills | Status: AC | PRN

## 2024-05-01 NOTE — Discharge Instructions (Signed)
 Please read below to learn more about the medications, dosages and frequencies that I recommend to help alleviate your symptoms and to get you feeling better soon:   Augmentin  (amoxicillin  - clavulanic acid):  Please take one (1) tablet twice daily for 7 days.This antibiotic can cause upset stomach, this will resolve once antibiotics are complete.  You are welcome to take a probiotic, eat yogurt, take Imodium while taking this medication.  Please avoid other systemic medications such as Maalox, Pepto-Bismol or milk of magnesia as they can interfere with the body's ability to absorb the antibiotics.    Decadron  (dexamethasone ): This is the only steroid that we know will address the inflammation caused by COVID-19 viral infection.  It will reduce headache, body aches, fever, sore throat, cough, shortness of breath.  It will not make you less contagious so please continue to isolate until you are feeling better.  Please take 1 tablet twice daily for the next 5 days.   Promethazine  DM: Promethazine  is both a nasal decongestant that dries up mucous membranes and an antinausea medication.  Promethazine  often makes most patients feel fairly sleepy.  DM is dextromethorphan, a single symptom reliever which is a cough suppressant found in many over-the-counter cough medications and combination cold preparations.  Please take 5 mL before bedtime to minimize your cough which will help you sleep better.  I have sent a prescription for this medication to your pharmacy because it cannot be purchased over-the-counter.   If symptoms have not meaningfully improved in the next 3 to 5 days, please return for repeat evaluation or follow-up with your regular provider.  If symptoms have worsened in the next 3 to 5 days, please go to the emergency room for further evaluation.    Thank you for visiting urgent care today.  We appreciate the opportunity to participate in your care.

## 2024-05-01 NOTE — ED Triage Notes (Signed)
 Pt c/o cough, headache, sore throat, and lack of energy for 6 days.   OTC mediations: mucinex, Advil

## 2024-05-01 NOTE — ED Provider Notes (Addendum)
"    Gina Casey    CSN: [id number] Arrival date & time: 05/01/24  1040    HISTORY   Chief Complaint  Patient presents with   Cough    Cough, headache, drainage, sore throat, no energy,  for approximately 6 days - Entered by patient   Headache   Sore Throat   HPI Gina Casey is a pleasant, 49 y.o. female who presents to urgent care today. Pt c/o nonproductive cough, persistent but not intractable generalized headache, sore/scratchy throat, nasal congestion, rhinorrhea, postnasal drip and lack of energy for 6 days.  States she has been taking Mucinex and Advil which provide temporary relief.  The history is provided by the patient.  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   OSA on CPAP 08/11/2016   01/2013 PSG RDI 9.6 Sat 91% Average Sat 97% using CPAP @ 11 cm of water   Patient Active Problem List   Diagnosis Date Noted   Abnormal weight gain 04/03/2021   Bipolar disorder (HCC) 04/06/2018   OSA on CPAP 08/11/2016   Low back pain 03/19/2015   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    Home Medications    Prior to Admission medications  Medication Sig Start Date End Date Taking? Authorizing Provider  amphetamine-dextroamphetamine (ADDERALL XR) 25 MG 24 hr capsule Take 50 mg by mouth  every morning.    [provider]  buPROPion (WELLBUTRIN XL) 300 MG 24 hr tablet Take by mouth.    [provider]  cevimeline (EVOXAC) 30 MG capsule cevimeline 30 mg capsule    [provider]  fluticasone (FLONASE) 50 MCG/ACT nasal spray  01/03/13   [provider]  lamoTRIgine (LAMICTAL) 200 MG tablet  03/30/18   [provider]  lithium carbonate (LITHOBID) 300 MG CR tablet  03/20/18   [provider]  naproxen  (NAPROSYN ) 500 MG tablet Take 1 tablet (500 mg total) by mouth 2 (two) times daily with a meal. 03/19/15   Joane Artist RAMAN, MD  predniSONE  (DELTASONE ) 50 MG tablet One tab PO daily for 5 days. 04/03/21   Curtis Debby PARAS, MD  progesterone (PROMETRIUM) 100 MG capsule progesterone micronized 100 mg capsule 03/29/12   [provider]  sertraline (ZOLOFT) 100 MG tablet  03/17/18   [provider]  Thyroid 81.25 MG TABS Nature-Throid 81.25 mg tablet    [provider]  tretinoin (RETIN-A) 0.025 % cream APPLY TOPICALLY NIGHTLY AS DIRECTED 12/03/12   [provider]  zolpidem (AMBIEN CR) 6.25 MG CR tablet Ambien CR 6.25 mg tablet,extended release  Take 1 tablet every day by oral route.    [provider]    Family History History reviewed. No pertinent family history. Social History Social History[1] Allergies   Sulfa antibiotics  Review of Systems Review of Systems  Constitutional:  Positive for activity change and fatigue. Negative for appetite change, chills, diaphoresis and fever.  HENT:  Positive for congestion, postnasal drip, rhinorrhea, sinus pressure, sinus pain and sore throat. Negative for ear pain, facial swelling, nosebleeds, sneezing and trouble swallowing.   Eyes:  Negative for photophobia, discharge and redness.  Respiratory:  Positive for cough. Negative for shortness of breath and wheezing.   Gastrointestinal:  Negative for abdominal pain, diarrhea, nausea and vomiting.   Musculoskeletal:  Negative for arthralgias, myalgias and neck pain.  Skin:  Negative for pallor and rash.  Allergic/Immunologic: Negative for environmental allergies.  Neurological:  Positive for headaches. Negative for dizziness, weakness and light-headedness.  Psychiatric/Behavioral:  Negative for suicidal ideas.      Physical Exam Vital Signs BP 112/73 (BP Location: Right Arm)   Pulse 66   Temp 98.3 F (36.8 C) (Oral)   Resp 18   SpO2 97%   No data found.  Physical Exam Vitals and nursing note reviewed.  Constitutional:      General: She is awake. She is not in acute distress.    Appearance: Normal appearance. She is well-developed and well-groomed. She is not ill-appearing.  HENT:     Head: Normocephalic and atraumatic.     Salivary Glands: Right salivary gland is not diffusely enlarged or tender. Left salivary gland is not diffusely enlarged or tender.     Right Ear: Hearing, ear canal and external ear normal. A middle ear effusion is present. Tympanic membrane is injected and bulging.     Left Ear: Hearing, tympanic membrane, ear canal and external ear normal.     Nose:     Right Turbinates: Not enlarged, swollen or pale.     Left Turbinates: Not enlarged, swollen or pale.     Right Sinus: Maxillary sinus tenderness and frontal sinus tenderness present.     Left Sinus: No maxillary sinus tenderness or frontal sinus tenderness.     Mouth/Throat:     Lips: Pink. No lesions.     Mouth: Mucous membranes are moist. Mucous membranes are pale. No oral lesions.     Tongue: No lesions. Tongue does not deviate from midline.     Palate: No mass and lesions.     Pharynx: Oropharynx is clear. Uvula midline. No pharyngeal swelling, oropharyngeal exudate, posterior oropharyngeal erythema, uvula swelling or postnasal drip.     Tonsils: No tonsillar exudate. 0 on the right. 0 on the left.  Eyes:     General: Lids are normal.        Right eye: No discharge.        Left eye: No  discharge.     Conjunctiva/sclera: Conjunctivae normal.     Right eye: Right conjunctiva is not injected.     Left eye: Left conjunctiva is not injected.  Neck:     Trachea: Trachea and phonation normal.  Cardiovascular:     Rate and Rhythm: Normal rate and regular rhythm.     Heart sounds: Normal heart sounds.  Pulmonary:     Effort: Pulmonary effort is normal.     Breath sounds: Normal breath sounds and air entry.  Chest:     Chest wall: No tenderness.  Musculoskeletal:        General: Normal range of motion.     Cervical back: Full passive range of motion without pain, normal range of motion and neck supple. Normal range of motion.  Lymphadenopathy:  Cervical: No cervical adenopathy.  Skin:    General: Skin is warm and dry.     Findings: No erythema or rash.  Neurological:     General: No focal deficit present.     Mental Status: She is alert and oriented to person, place, and time. Mental status is at baseline.  Psychiatric:        Attention and Perception: Attention and perception normal.        Mood and Affect: Mood and affect normal.        Speech: Speech normal.        Behavior: Behavior normal. Behavior is cooperative.        Thought Content: Thought content normal.     Visual Acuity Right Eye Distance:   Left Eye Distance:   Bilateral Distance:    Right Eye Near:   Left Eye Near:    Bilateral Near:     Casey Couse / Diagnostics / Procedures:     Radiology No results found.  Procedures Procedures (including critical care time) EKG  Pending results:  Labs Reviewed - No data to display  Medications Ordered in Casey: Medications - No data to display  Casey Diagnoses / Final Clinical Impressions(s)   I have reviewed the triage vital signs and the nursing notes.  Pertinent labs & imaging results that were available during my care of the patient were reviewed by me and considered in my medical decision making (see chart for details).    Final diagnoses:   Viral URI with cough  Sore throat  Non-productive cough  Acute nonintractable headache, unspecified headache type  Acute bacterial sinusitis   Viral testing not indicated due to symptoms lasting greater than 6 days.  Physical exam findings concerning for acute bacterial sinusitis on the right, recommend Augmentin  for 7 days.  For body aches, headache, recommend dexamethasone  6 mg twice daily for 5 days in case symptoms are due to COVID-19 infection.  Patient advised does not need to take for full 5 days, only until feeling better.  Promethazine  DM provided for nighttime cough.  Conservative care recommended.  Return precautions advised.  Please see discharge instructions below for details of plan of care as provided to patient. ED Prescriptions     Medication Sig Dispense Auth. Provider   amoxicillin -clavulanate (AUGMENTIN ) 875-125 MG tablet Take 1 tablet by mouth 2 (two) times daily for 7 days. 14 tablet Joesph Shaver Scales, PA-C   promethazine -dextromethorphan (PROMETHAZINE -DM) 6.25-15 MG/5ML syrup Take 5 mLs by mouth 4 (four) times daily as needed for cough. 118 mL Joesph Shaver Scales, PA-C   dexamethasone  (DECADRON ) 6 MG tablet Take 1 tablet (6 mg total) by mouth 2 (two) times daily with a meal for 5 days. 10 tablet Joesph Shaver Scales, PA-C      PDMP not reviewed this encounter.    Discharge Instructions      Please read below to learn more about the medications, dosages and frequencies that I recommend to help alleviate your symptoms and to get you feeling better soon:   Augmentin  (amoxicillin  - clavulanic acid):  Please take one (1) tablet twice daily for 7 days.This antibiotic can cause upset stomach, this will resolve once antibiotics are complete.  You are welcome to take a probiotic, eat yogurt, take Imodium while taking this medication.  Please avoid other systemic medications such as Maalox, Pepto-Bismol or milk of magnesia as they can interfere with the body's  ability to absorb the antibiotics.    Decadron  (dexamethasone ): This  is the only steroid that we know will address the inflammation caused by COVID-19 viral infection.  It will reduce headache, body aches, fever, sore throat, cough, shortness of breath.  It will not make you less contagious so please continue to isolate until you are feeling better.  Please take 1 tablet twice daily for the next 5 days.   Promethazine  DM: Promethazine  is both a nasal decongestant that dries up mucous membranes and an antinausea medication.  Promethazine  often makes most patients feel fairly sleepy.  DM is dextromethorphan, a single symptom reliever which is a cough suppressant found in many over-the-counter cough medications and combination cold preparations.  Please take 5 mL before bedtime to minimize your cough which will help you sleep better.  I have sent a prescription for this medication to your pharmacy because it cannot be purchased over-the-counter.   If symptoms have not meaningfully improved in the next 3 to 5 days, please return for repeat evaluation or follow-up with your regular provider.  If symptoms have worsened in the next 3 to 5 days, please go to the emergency room for further evaluation.    Thank you for visiting urgent care today.  We appreciate the opportunity to participate in your care.       Disposition Upon Discharge:  Condition: stable for discharge home  Patient presented with an acute illness with associated systemic symptoms and significant discomfort requiring urgent management. In my opinion, this is a condition that a prudent lay person (someone who possesses an average knowledge of health and medicine) may potentially expect to result in complications if not addressed urgently such as respiratory distress, impairment of bodily function or dysfunction of bodily organs.   Routine symptom specific, illness specific and/or disease specific instructions were discussed with the  patient and/or caregiver at length.   As such, the patient has been evaluated and assessed, work-up was performed and treatment was provided in alignment with urgent care protocols and evidence based medicine.  Patient/parent/caregiver has been advised that the patient may require follow up for further testing and treatment if the symptoms continue in spite of treatment, as clinically indicated and appropriate.  Patient/parent/caregiver has been advised to return to the [facility] or PCP if no better; to PCP or the Emergency Department if new signs and symptoms develop, or if the current signs or symptoms continue to change or worsen for further workup, evaluation and treatment as clinically indicated and appropriate  The patient will follow up with their current PCP if and as advised. If the patient does not currently have a PCP we will assist them in obtaining one.   The patient may need specialty follow up if the symptoms continue, in spite of conservative treatment and management, for further workup, evaluation, consultation and treatment as clinically indicated and appropriate.  Patient/parent/caregiver verbalized understanding and agreement of plan as discussed.  All questions were addressed during visit.  Please see discharge instructions below for further details of plan.  This office note has been dictated using Teaching laboratory technician.  Unfortunately, this method of dictation can sometimes lead to typographical or grammatical errors.  I apologize for your inconvenience in advance if this occurs.  Please do not hesitate to reach out to me if clarification is needed.      Joesph Shaver Scales, PA-C 05/01/24 1157     [1]  Social History Tobacco Use   Smoking status: Never   Smokeless tobacco: Never     Joesph Shaver Scales, PA-C  05/01/24 1206 ° °"

## 2024-05-01 NOTE — ED Notes (Signed)
 Provider made aware of the patients CSSR scale assessment. The patient was provided the [facility] information and made aware that they are open 24/7 and take walk in appointments.
